# Patient Record
Sex: Female | Born: 1990 | Race: White | Hispanic: No | Marital: Single
Health system: Midwestern US, Community
[De-identification: ages and names within clinical notes are randomized; demographics above are authoritative.]

## PROBLEM LIST (undated history)

## (undated) ENCOUNTER — Inpatient Hospital Stay (HOSPITAL_COMMUNITY): Payer: Self-pay

## (undated) DIAGNOSIS — F319 Bipolar disorder, unspecified: Secondary | ICD-10-CM

## (undated) DIAGNOSIS — IMO0001 Reserved for inherently not codable concepts without codable children: Secondary | ICD-10-CM

## (undated) DIAGNOSIS — O26873 Cervical shortening, third trimester: Secondary | ICD-10-CM

## (undated) DIAGNOSIS — R319 Hematuria, unspecified: Secondary | ICD-10-CM

## (undated) DIAGNOSIS — F419 Anxiety disorder, unspecified: Secondary | ICD-10-CM

## (undated) DIAGNOSIS — F1910 Other psychoactive substance abuse, uncomplicated: Secondary | ICD-10-CM

## (undated) DIAGNOSIS — F909 Attention-deficit hyperactivity disorder, unspecified type: Secondary | ICD-10-CM

## (undated) DIAGNOSIS — O039 Complete or unspecified spontaneous abortion without complication: Secondary | ICD-10-CM

## (undated) DIAGNOSIS — N898 Other specified noninflammatory disorders of vagina: Secondary | ICD-10-CM

## (undated) DIAGNOSIS — R309 Painful micturition, unspecified: Secondary | ICD-10-CM

## (undated) DIAGNOSIS — F32A Depression, unspecified: Secondary | ICD-10-CM

## (undated) DIAGNOSIS — F329 Major depressive disorder, single episode, unspecified: Secondary | ICD-10-CM

## (undated) HISTORY — PX: WISDOM TOOTH EXTRACTION: SHX21

## (undated) HISTORY — PX: INDUCED ABORTION: SHX677

## (undated) HISTORY — DX: Painful micturition, unspecified: R30.9

## (undated) HISTORY — DX: Other specified noninflammatory disorders of vagina: N89.8

## (undated) HISTORY — DX: Attention-deficit hyperactivity disorder, unspecified type: F90.9

## (undated) HISTORY — DX: Anxiety disorder, unspecified: F41.9

## (undated) HISTORY — DX: Hematuria, unspecified: R31.9

## (undated) HISTORY — DX: Other psychoactive substance abuse, uncomplicated: F19.10

## (undated) HISTORY — DX: Complete or unspecified spontaneous abortion without complication: O03.9

## (undated) HISTORY — DX: Reserved for inherently not codable concepts without codable children: IMO0001

## (undated) HISTORY — DX: Bipolar disorder, unspecified: F31.9

---
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
## 2011-01-02 LAB — AMB POC URINALYSIS DIP STICK AUTO W/O MICRO
Bilirubin (UA POC): NEGATIVE
Glucose (UA POC): NEGATIVE
Ketones (UA POC): NEGATIVE
Nitrites (UA POC): NEGATIVE
Protein (UA POC): NEGATIVE mg/dL
Specific gravity (UA POC): 1.025 (ref 1.001–1.035)
Urobilinogen (UA POC): 0.2
pH (UA POC): 5.5 (ref 4.6–8.0)

## 2011-01-02 MED ORDER — CIPROFLOXACIN 250 MG TAB
250 mg | ORAL_TABLET | Freq: Two times a day (BID) | ORAL | Status: AC
Start: 2011-01-02 — End: 2011-01-07

## 2011-01-02 MED ORDER — FLUCONAZOLE 150 MG TAB
150 mg | ORAL_TABLET | Freq: Every day | ORAL | Status: AC
Start: 2011-01-02 — End: 2011-01-03

## 2011-01-02 MED ORDER — LORATADINE 10 MG TAB
10 mg | ORAL_TABLET | Freq: Every day | ORAL | Status: AC
Start: 2011-01-02 — End: 2011-12-28

## 2011-01-02 NOTE — Progress Notes (Signed)
C/o ear ache for 2 weeks feels fluid no otc medication    C/o UTI,  Burning when she urinates, it  Itches has hx of UTI has been using Monistat    Results for orders placed in visit on 01/02/11   AMB POC URINALYSIS DIP STICK AUTO W/O MICRO       Component Value Range    Color Yellow  (none)     Clarity Cloudy  (none)     Glucose Negative  (none)     Bilirubin Negative  (none)     Ketones Negative  (none)     Spec.Grav. 1.025  1.001 - 1.035     Blood Trace  (none)     pH 5.5  4.6 - 8.0     Protein negative  ZOX:WRUEAVWU(JW/JX)    Urobilinogen 0.2 mg/dL      Nitrites Negative  (none)     Leukocyte esterase 1+  (none)

## 2011-01-02 NOTE — Progress Notes (Signed)
C/o ear ache for 2 weeks feels fluid no otc medication    C/o UTI,  Burning when she urinates, it  Itches has hx of UTI has been using Monistat    Results for orders placed in visit on 01/02/11   AMB POC URINALYSIS DIP STICK AUTO W/O MICRO       Component Value Range    Color Yellow  (none)     Clarity Cloudy  (none)     Glucose Negative  (none)     Bilirubin Negative  (none)     Ketones Negative  (none)     Spec.Grav. 1.025  1.001 - 1.035     Blood Trace  (none)     pH 5.5  4.6 - 8.0     Protein negative  ZOX:WRUEAVWU(JW/JX)    Urobilinogen 0.2 mg/dL      Nitrites Negative  (none)     Leukocyte esterase 1+  (none)      Chief Complaint   Patient presents with   ??? Ear Pain   ??? Urinary Pain     she is a 20 y.o. year old female who presents for evalution.    Reviewed and agree with Nurse Note and duplicated in this note.  Reviewed PmHx, RxHx, FmHx, SocHx, AllgHx and updated and dated in the chart.    Review of Systems - negative except as listed above    Objective:     Filed Vitals:    01/02/11 0927   BP: 130/89   Pulse: 87   Height: 5\' 5"  (1.651 m)   Weight: 142 lb (64.411 kg)     Physical Examination: General appearance - alert, well appearing, and in no distress  Eyes - pupils equal and reactive, extraocular eye movements intact  Ears - right TM red, dull, bulging, left TM red, dull, bulging  Nose - normal and patent, no erythema, discharge or polyps  Mouth - mucous membranes moist, pharynx normal without lesions  Neck - supple, no significant adenopathy  Chest - clear to auscultation, no wheezes, rales or rhonchi, symmetric air entry  Heart - normal rate, regular rhythm, normal S1, S2, no murmurs, rubs, clicks or gallops  Abdomen - soft, nontender, nondistended, no masses or organomegaly    Assessment/ Plan:   [place] was seen today for ear pain and urinary pain.    Diagnoses and associated orders for this visit:    Uti (lower urinary tract infection)  - fluconazole (DIFLUCAN) 150 mg tablet; Take 1 Tab by mouth  daily for 1 day.  - ciprofloxacin (CIPRO) 250 mg tablet; Take 1 Tab by mouth every twelve (12) hours for 5 days.    Burning with urination  - AMB POC URINALYSIS DIP STICK AUTO W/O MICRO-pos  - fluconazole (DIFLUCAN) 150 mg tablet; Take 1 Tab by mouth daily for 1 day.  - ciprofloxacin (CIPRO) 250 mg tablet; Take 1 Tab by mouth every twelve (12) hours for 5 days.    Etd (eustachian tube dysfunction)  - loratadine (CLARITIN) 10 mg tablet; Take 1 Tab by mouth daily for 360 days.  -add rx       Follow-up Disposition:  Return if symptoms worsen or fail to improve.    I have discussed the diagnosis with the patient and the intended plan as seen in the above orders.  The patient has received an after-visit summary and questions were answered concerning future plans.     Medication Side Effects and Warnings were discussed with patient: yes  Patient Labs were reviewed and or requested: yes  Patient Past Records were reviewed and or requested  yes    Danley Danker, M.D.    There are no Patient Instructions on file for this visit.

## 2011-01-05 NOTE — Telephone Encounter (Signed)
Please call in medicine  as order on the 21st. Medication not received by pharmacy. Pt wants the medication sent to [facility] [address] in [place].     Medication was sent to [facility] on file.

## 2011-01-05 NOTE — Telephone Encounter (Signed)
[phone]    Spoke with pharmacist. Cancelled medicine sent to [facility]    [phone]    Medication called in to this pharmacy as requested per pt;     [phone] (home)     Left voicemail notifying patient rx ready for pick up at requested pharmacy.

## 2011-01-10 MED ORDER — CIPROFLOXACIN 250 MG TAB
250 mg | ORAL_TABLET | Freq: Two times a day (BID) | ORAL | Status: DC
Start: 2011-01-10 — End: 2013-04-23

## 2011-01-10 MED ORDER — FLUCONAZOLE 150 MG TAB
150 mg | ORAL_TABLET | Freq: Every day | ORAL | Status: DC
Start: 2011-01-10 — End: 2011-03-16

## 2011-01-10 NOTE — Telephone Encounter (Signed)
Pt seen in office on the 21st, medication were to  Be called to her pharmacy, Claratin, Fluconazole, ciprosoxacin.. Pt states she never has received them. pts number [phone].

## 2011-01-10 NOTE — Telephone Encounter (Signed)
Patient never got the prescription from the pharmacy

## 2011-01-10 NOTE — Telephone Encounter (Signed)
Pt would like prescription sent to [facility] number [phone]

## 2011-03-16 LAB — AMB POC URINALYSIS DIP STICK AUTO W/O MICRO
Bilirubin (UA POC): NEGATIVE
Blood (UA POC): NEGATIVE
Glucose (UA POC): NEGATIVE
Ketones (UA POC): NEGATIVE
Nitrites (UA POC): NEGATIVE
Protein (UA POC): 0 mg/dL
Specific gravity (UA POC): 1.03 (ref 1.001–1.035)
Urobilinogen (UA POC): 0.2
pH (UA POC): 6 (ref 4.6–8.0)

## 2011-03-16 MED ORDER — FLUCONAZOLE 150 MG TAB
150 mg | ORAL_TABLET | Freq: Every day | ORAL | Status: AC
Start: 2011-03-16 — End: 2011-03-17

## 2011-03-16 MED ORDER — ETONOGESTREL 0.12 MG-ETHINYL ESTRADIOL 0.015 MG/24 HR VAGINAL RING
VAGINAL | Status: DC
Start: 2011-03-16 — End: 2011-04-12

## 2011-03-16 NOTE — Progress Notes (Signed)
Pt here c/o of vaginal itching x 2 weeks.  Says that her urine has also been cloudy.  Has used Monistat otc, no improvement.    Patient would like to start on birth control to help control irregular menstrual cycles, states that she has trouble remembering to take pills    Subjective:   20 y.o. female complains of white vaginal discharge for 2 weeks..  Denies abnormal vaginal bleeding or significant pelvic pain or  fever. No UTI symptoms. Denies history of known exposure to STD.    Patient's last menstrual period was 03/01/2011.    Objective:   She appears well, afebrile.  Abdomen: benign, soft, nontender, no masses.  Pelvic Exam: examination not indicated.      Assessment/Plan:   C. albicans vulvovaginitis  GC and chlamydia DNA  probe sent to lab.  Treatment: abstain from coitus during course of treatment  ROV prn if symptoms persist or worsen.     1. Cloudy urine  AMB POC URINALYSIS DIP STICK AUTO W/O MICRO   2. Vaginal discharge  fluconazole (DIFLUCAN) 150 mg tablet, NUSWAB VAGINITIS PLUS (VG+)   3. Dysmenorrhea - reviewed choices in birth control, pt would like to try ring, reviewed risks benefits and appropriate use ethinyl estradiol-etonogestrel (NUVARING) 0.12-0.015 mg/24 hr vaginal ring     Encounter Diagnoses   Name Primary?   ??? Cloudy urine Yes   ??? Vaginal discharge    ??? Dysmenorrhea      Orders Placed This Encounter   ??? NUSWAB VAGINITIS PLUS (VG+)   ??? AMB POC URINALYSIS DIP STICK AUTO W/O MICRO   ??? ethinyl estradiol-etonogestrel (NUVARING) 0.12-0.015 mg/24 hr vaginal ring   ??? fluconazole (DIFLUCAN) 150 mg tablet   .

## 2011-03-16 NOTE — Progress Notes (Signed)
Quick Note:    Reviewed with patient during appointment  ______

## 2011-03-16 NOTE — Patient Instructions (Signed)
Vaginal Yeast Infection: After Your Visit  Your Care Instructions  A vaginal yeast infection is caused by too many yeast cells in the vagina. These infections are common in women of all ages. Although itching, vaginal discharge and irritation, and other symptoms can bother you, yeast infections do not usually cause other health problems.  Some medicines???such as antibiotics, birth control pills, hormones, and steroids???can increase your risk of getting a yeast infection. You may also be more likely to get a yeast infection if you are pregnant, have diabetes, douche, or wear tight clothes.  With treatment, most yeast infections get better in 2 to 3 days.  Follow-up care is a key part of your treatment and safety. Be sure to make and go to all appointments, and call your doctor if you are having problems. It???s also a good idea to know your test results and keep a list of the medicines you take.  How can you care for yourself at home?  ?? Take your medicines exactly as prescribed. Call your doctor if you think you are having a problem with your medicine.   ?? Ask your doctor about over-the-counter medicines for yeast infections. They may cost less than prescription medicines. If you use an over-the-counter treatment, read and follow all instructions on the label.   ?? Do not use tampons while using a vaginal cream or suppository. The tampons can absorb the medicine. Use pads instead.   ?? Wear loose cotton clothing. Do not wear nylon or other fabric that holds body heat and moisture close to the skin.   ?? Do not scratch. Relieve itching with a cold pack or a cool bath.   ?? Do not wash your vaginal area more than once a day. Use plain water or a mild, unscented soap.   ?? Change out of wet swimsuits after swimming.   ?? Do not have sex until you have finished your treatment.   ?? Do not douche.   When should you call for help?  Call your doctor now or seek immediate medical care if:  ?? You have unexpected vaginal bleeding.   ??  You have new or increased pain in your vagina or pelvis.   Watch closely for changes in your health, and be sure to contact your doctor if:  ?? You have a fever.   ?? You are not getting better after 2 days.   ?? Your symptoms return after you finish your medicines.     Where can you learn more?    Go to [email or web address]   Enter [phone] in the search box to learn more about "Vaginal Yeast Infection: After Your Visit."    ?? 2006-2012 [employer]. Care instructions adapted under license by [employer] (which disclaims liability or warranty for this information). This care instruction is for use with your licensed healthcare professional. If you have questions about a medical condition or this instruction, always ask your healthcare professional. [employer] disclaims any warranty or liability for your use of this information.  Content Version: 9.3.73196; Last Revised: November 08, 2009

## 2011-03-16 NOTE — Progress Notes (Signed)
Results for orders placed in visit on 03/16/11   AMB POC URINALYSIS DIP STICK AUTO W/O MICRO       Component Value Range    Color Yellow  (none)     Clarity Clear  (none)     Glucose Negative  (none)     Bilirubin Negative  (none)     Ketones Negative  (none)     Spec.Grav. 1.030  1.001 - 1.035     Blood Negative  (none)     pH 6  4.6 - 8.0     Protein 0  ZOX:WRUEAVWU(JW/JX)    Urobilinogen 0.2 mg/dL      Nitrites Negative  (none)     Leukocyte esterase 2+  (none)

## 2011-03-16 NOTE — Progress Notes (Signed)
Pt here c/o of vaginal itching x 2 weeks.  Says that her urine has also been cloudy.  Has used Monistat otc, no improvement.

## 2011-03-18 LAB — NUSWAB VAGINITIS PLUS (VG+)
Atopobium vaginae: 0 Score
BV NAA INTERPRETATION: NEGATIVE
BVAB 2: 0 Score
C. albicans, NAA: POSITIVE — AB
C. glabrata, NAA: NEGATIVE
C. trachomatis, NAA: NEGATIVE
Megasphaera 1: 0 Score
N. gonorrhoeae, NAA: NEGATIVE
T. vaginalis, NAA: NEGATIVE
Total score: 0

## 2011-03-19 NOTE — Progress Notes (Signed)
Quick Note:    Swab positive for yeast, pt treated. Please inform pt of results.  ______

## 2011-03-20 NOTE — Progress Notes (Signed)
Quick Note:    Left a message for pt to return call to office.  ______

## 2011-04-12 MED ORDER — ETONOGESTREL 0.12 MG-ETHINYL ESTRADIOL 0.015 MG/24 HR VAGINAL RING
VAGINAL | Status: DC
Start: 2011-04-12 — End: 2013-04-23

## 2011-05-09 LAB — URINALYSIS, ROUTINE W REFLEX MICROSCOPIC
Bilirubin Urine: NEGATIVE
Glucose, UA: NEGATIVE
Hgb urine dipstick: NEGATIVE
Ketones, ur: NEGATIVE
Nitrite: NEGATIVE
Protein, ur: NEGATIVE
Specific Gravity, Urine: 1.008
Urobilinogen, UA: 1
pH: 7

## 2011-05-09 LAB — WET PREP, GENITAL
Trich, Wet Prep: NONE SEEN
Yeast Wet Prep HPF POC: NONE SEEN

## 2011-05-09 LAB — GC/CHLAMYDIA PROBE AMP, URINE
Chlamydia, Swab/Urine, PCR: NEGATIVE
GC Probe Amp, Urine: NEGATIVE

## 2011-05-09 LAB — GC/CHLAMYDIA PROBE AMP, GENITAL
Chlamydia, DNA Probe: NEGATIVE
GC Probe Amp, Genital: NEGATIVE

## 2011-05-09 LAB — RPR: RPR Ser Ql: NONREACTIVE

## 2011-05-09 LAB — PREGNANCY, URINE: Preg Test, Ur: NEGATIVE

## 2011-05-19 MED ORDER — FLUCONAZOLE 150 MG TAB
150 mg | ORAL_TABLET | Freq: Every day | ORAL | Status: AC
Start: 2011-05-19 — End: 2011-05-20

## 2011-05-19 NOTE — Telephone Encounter (Signed)
Patient states that the medication she was prescribed for her yeast infection is not working.     [phone]

## 2011-05-19 NOTE — Telephone Encounter (Signed)
Refill completed

## 2011-05-19 NOTE — Telephone Encounter (Signed)
Did the infection never clear up from two months ago? Or does pt need a refill?

## 2011-05-19 NOTE — Telephone Encounter (Signed)
Patient needs refill for diflucan

## 2011-08-15 NOTE — L&D Delivery Note (Cosign Needed)
Delivery Note At 9:53 AM a viable female was delivered via Vaginal, Spontaneous Delivery (Presentation: Left Occiput Anterior).  APGAR: pending ; weight pending.   Placenta status: Intact, Spontaneous.  Cord: 3 vessels with the following complications: None.  Anesthesia: Epidural  Episiotomy: None Lacerations: None Est. Blood Loss (mL): 300  Mom to postpartum.  Baby to nursery-stable.  Clancy Gourd 02/02/2012, 10:05 AM  Supervised by [place], CNM

## 2011-08-23 LAB — OB RESULTS CONSOLE HGB/HCT, BLOOD
HCT: 37 %
HCT: 37 %
Hemoglobin: 12.4 g/dL
Hemoglobin: 12.4 g/dL

## 2011-08-23 LAB — OB RESULTS CONSOLE ANTIBODY SCREEN: Antibody Screen: NEGATIVE

## 2011-08-23 LAB — OB RESULTS CONSOLE ABO/RH: RH Type: POSITIVE

## 2011-08-23 LAB — OB RESULTS CONSOLE PLATELET COUNT
Platelets: 325 10*3/uL
Platelets: 325 10*3/uL

## 2011-08-23 LAB — OB RESULTS CONSOLE RUBELLA ANTIBODY, IGM: Rubella: IMMUNE

## 2011-08-23 LAB — OB RESULTS CONSOLE HIV ANTIBODY (ROUTINE TESTING): HIV: NONREACTIVE

## 2011-08-23 LAB — OB RESULTS CONSOLE HEPATITIS B SURFACE ANTIGEN: Hepatitis B Surface Ag: NEGATIVE

## 2011-09-01 LAB — OB RESULTS CONSOLE RPR: RPR: NONREACTIVE

## 2011-10-27 LAB — OB RESULTS CONSOLE HGB/HCT, BLOOD
HCT: 35 %
Hemoglobin: 11.3 g/dL

## 2011-10-27 LAB — GLUCOSE TOLERANCE, 1 HOUR: Glucose, 1 Hour GTT: 86

## 2012-01-03 ENCOUNTER — Encounter: Payer: Self-pay | Admitting: Family

## 2012-01-03 ENCOUNTER — Other Ambulatory Visit: Payer: Self-pay | Admitting: Family

## 2012-01-03 ENCOUNTER — Ambulatory Visit (INDEPENDENT_AMBULATORY_CARE_PROVIDER_SITE_OTHER): Payer: Self-pay | Admitting: Family

## 2012-01-03 VITALS — BP 106/70 | Temp 97.8°F | Ht 65.0 in | Wt 143.3 lb

## 2012-01-03 DIAGNOSIS — O0930 Supervision of pregnancy with insufficient antenatal care, unspecified trimester: Secondary | ICD-10-CM | POA: Insufficient documentation

## 2012-01-03 DIAGNOSIS — Z3480 Encounter for supervision of other normal pregnancy, unspecified trimester: Secondary | ICD-10-CM

## 2012-01-03 DIAGNOSIS — Z3490 Encounter for supervision of normal pregnancy, unspecified, unspecified trimester: Secondary | ICD-10-CM

## 2012-01-03 DIAGNOSIS — N898 Other specified noninflammatory disorders of vagina: Secondary | ICD-10-CM

## 2012-01-03 LAB — POCT URINALYSIS DIP (DEVICE)
Bilirubin Urine: NEGATIVE
Glucose, UA: 100 mg/dL — AB
Hgb urine dipstick: NEGATIVE
Ketones, ur: NEGATIVE mg/dL
Nitrite: NEGATIVE
Protein, ur: 30 mg/dL — AB
Specific Gravity, Urine: 1.02 (ref 1.005–1.030)
Urobilinogen, UA: 0.2 mg/dL (ref 0.0–1.0)
pH: 7.5 (ref 5.0–8.0)

## 2012-01-03 LAB — OB RESULTS CONSOLE GBS: GBS: NEGATIVE

## 2012-01-03 MED ORDER — FLUCONAZOLE 150 MG PO TABS: 150.0000 mg | ORAL_TABLET | Freq: Once | ORAL | Status: AC

## 2012-01-03 NOTE — Progress Notes (Signed)
Pt is a transfer from [place]; records in Media tab; reports white vaginal discharge with itching; GBS/GC & CT with wet prep completed; thick white discharge seen, +vaginal redness.  Reviewed signs of preterm labor.  RX Diflucan.

## 2012-01-03 NOTE — Progress Notes (Signed)
Pulse 84 Has white discharge with itching.

## 2012-01-04 LAB — GC/CHLAMYDIA PROBE AMP, GENITAL
Chlamydia, DNA Probe: NEGATIVE
GC Probe Amp, Genital: NEGATIVE

## 2012-01-04 LAB — WET PREP, GENITAL
Clue Cells Wet Prep HPF POC: NONE SEEN
Trich, Wet Prep: NONE SEEN
Yeast Wet Prep HPF POC: NONE SEEN

## 2012-01-05 LAB — CULTURE, OB URINE: Colony Count: 1000

## 2012-01-06 ENCOUNTER — Encounter: Payer: Self-pay | Admitting: Family

## 2012-01-06 LAB — CULTURE, BETA STREP (GROUP B ONLY)

## 2012-01-17 ENCOUNTER — Encounter: Payer: Self-pay | Admitting: *Deleted

## 2012-01-17 ENCOUNTER — Ambulatory Visit (INDEPENDENT_AMBULATORY_CARE_PROVIDER_SITE_OTHER): Payer: Self-pay | Admitting: Family Medicine

## 2012-01-17 VITALS — BP 119/80 | Temp 96.6°F | Wt 146.6 lb

## 2012-01-17 DIAGNOSIS — Z3480 Encounter for supervision of other normal pregnancy, unspecified trimester: Secondary | ICD-10-CM

## 2012-01-17 DIAGNOSIS — Z3490 Encounter for supervision of normal pregnancy, unspecified, unspecified trimester: Secondary | ICD-10-CM

## 2012-01-17 LAB — POCT URINALYSIS DIP (DEVICE)
Bilirubin Urine: NEGATIVE
Glucose, UA: NEGATIVE mg/dL
Ketones, ur: NEGATIVE mg/dL
Nitrite: NEGATIVE
Protein, ur: 30 mg/dL — AB
Specific Gravity, Urine: 1.02 (ref 1.005–1.030)
Urobilinogen, UA: 0.2 mg/dL (ref 0.0–1.0)
pH: 8.5 — ABNORMAL HIGH (ref 5.0–8.0)

## 2012-01-17 NOTE — Patient Instructions (Signed)
Pregnancy - Third Trimester The third trimester of pregnancy (the last 3 months) is a period of the most rapid growth for you and your baby. The baby approaches a length of 20 inches and a weight of 6 to 10 pounds. The baby is adding on fat and getting ready for life outside your body. While inside, babies have periods of sleeping and waking, suck their thumbs, and hiccups. You can often feel small contractions of the uterus. This is false labor. It is also called Braxton-Hicks contractions. This is like a practice for labor. The usual problems in this stage of pregnancy include more difficulty breathing, swelling of the hands and feet from water retention, and having to urinate more often because of the uterus and baby pressing on your bladder.  PRENATAL EXAMS  Blood work may continue to be done during prenatal exams. These tests are done to check on your health and the probable health of your baby. Blood work is used to follow your blood levels (hemoglobin). Anemia (low hemoglobin) is common during pregnancy. Iron and vitamins are given to help prevent this. You may also continue to be checked for diabetes. Some of the past blood tests may be done again.   The size of the uterus is measured during each visit. This makes sure your baby is growing properly according to your pregnancy dates.   Your blood pressure is checked every prenatal visit. This is to make sure you are not getting toxemia.   Your urine is checked every prenatal visit for infection, diabetes and protein.   Your weight is checked at each visit. This is done to make sure gains are happening at the suggested rate and that you and your baby are growing normally.   Sometimes, an ultrasound is performed to confirm the position and the proper growth and development of the baby. This is a test done that bounces harmless sound waves off the baby so your caregiver can more accurately determine due dates.   Discuss the type of pain  medication and anesthesia you will have during your labor and delivery.   Discuss the possibility and anesthesia if a Cesarean Section might be necessary.   Inform your caregiver if there is any mental or physical violence at home.  Sometimes, a specialized non-stress test, contraction stress test and biophysical profile are done to make sure the baby is not having a problem. Checking the amniotic fluid surrounding the baby is called an amniocentesis. The amniotic fluid is removed by sticking a needle into the belly (abdomen). This is sometimes done near the end of pregnancy if an early delivery is required. In this case, it is done to help make sure the baby's lungs are mature enough for the baby to live outside of the womb. If the lungs are not mature and it is unsafe to deliver the baby, an injection of cortisone medication is given to the mother 1 to 2 days before the delivery. This helps the baby's lungs mature and makes it safer to deliver the baby. CHANGES OCCURING IN THE THIRD TRIMESTER OF PREGNANCY Your body goes through many changes during pregnancy. They vary from person to person. Talk to your caregiver about changes you notice and are concerned about.  During the last trimester, you have probably had an increase in your appetite. It is normal to have cravings for certain foods. This varies from person to person and pregnancy to pregnancy.   You may begin to get stretch marks on your hips,   abdomen, and breasts. These are normal changes in the body during pregnancy. There are no exercises or medications to take which prevent this change.   Constipation may be treated with a stool softener or adding bulk to your diet. Drinking lots of fluids, fiber in vegetables, fruits, and whole grains are helpful.   Exercising is also helpful. If you have been very active up until your pregnancy, most of these activities can be continued during your pregnancy. If you have been less active, it is helpful  to start an exercise program such as walking. Consult your caregiver before starting exercise programs.   Avoid all smoking, alcohol, un-prescribed drugs, herbs and "street drugs" during your pregnancy. These chemicals affect the formation and growth of the baby. Avoid chemicals throughout the pregnancy to ensure the delivery of a healthy infant.   Backache, varicose veins and hemorrhoids may develop or get worse.   You will tire more easily in the third trimester, which is normal.   The baby's movements may be stronger and more often.   You may become short of breath easily.   Your belly button may stick out.   A yellow discharge may leak from your breasts called colostrum.   You may have a bloody mucus discharge. This usually occurs a few days to a week before labor begins.  HOME CARE INSTRUCTIONS   Keep your caregiver's appointments. Follow your caregiver's instructions regarding medication use, exercise, and diet.   During pregnancy, you are providing food for you and your baby. Continue to eat regular, well-balanced meals. Choose foods such as meat, fish, milk and other low fat dairy products, vegetables, fruits, and whole-grain breads and cereals. Your caregiver will tell you of the ideal weight gain.   A physical sexual relationship may be continued throughout pregnancy if there are no other problems such as early (premature) leaking of amniotic fluid from the membranes, vaginal bleeding, or belly (abdominal) pain.   Exercise regularly if there are no restrictions. Check with your caregiver if you are unsure of the safety of your exercises. Greater weight gain will occur in the last 2 trimesters of pregnancy. Exercising helps:   Control your weight.   Get you in shape for labor and delivery.   You lose weight after you deliver.   Rest a lot with legs elevated, or as needed for leg cramps or low back pain.   Wear a good support or jogging bra for breast tenderness during  pregnancy. This may help if worn during sleep. Pads or tissues may be used in the bra if you are leaking colostrum.   Do not use hot tubs, steam rooms, or saunas.   Wear your seat belt when driving. This protects you and your baby if you are in an accident.   Avoid raw meat, cat litter boxes and soil used by cats. These carry germs that can cause birth defects in the baby.   It is easier to loose urine during pregnancy. Tightening up and strengthening the pelvic muscles will help with this problem. You can practice stopping your urination while you are going to the bathroom. These are the same muscles you need to strengthen. It is also the muscles you would use if you were trying to stop from passing gas. You can practice tightening these muscles up 10 times a set and repeating this about 3 times per day. Once you know what muscles to tighten up, do not perform these exercises during urination. It is more likely   to cause an infection by backing up the urine.   Ask for help if you have financial, counseling or nutritional needs during pregnancy. Your caregiver will be able to offer counseling for these needs as well as refer you for other special needs.   Make a list of emergency phone numbers and have them available.   Plan on getting help from family or friends when you go home from the hospital.   Make a trial run to the hospital.   Take prenatal classes with the father to understand, practice and ask questions about the labor and delivery.   Prepare the baby's room/nursery.   Do not travel out of the city unless it is absolutely necessary and with the advice of your caregiver.   Wear only low or no heal shoes to have better balance and prevent falling.  MEDICATIONS AND DRUG USE IN PREGNANCY  Take prenatal vitamins as directed. The vitamin should contain 1 milligram of folic acid. Keep all vitamins out of reach of children. Only a couple vitamins or tablets containing iron may be fatal  to a baby or young child when ingested.   Avoid use of all medications, including herbs, over-the-counter medications, not prescribed or suggested by your caregiver. Only take over-the-counter or prescription medicines for pain, discomfort, or fever as directed by your caregiver. Do not use aspirin, ibuprofen (Motrin, Advil, Nuprin) or naproxen (Aleve) unless OK'd by your caregiver.   Let your caregiver also know about herbs you may be using.   Alcohol is related to a number of birth defects. This includes fetal alcohol syndrome. All alcohol, in any form, should be avoided completely. Smoking will cause low birth rate and premature babies.   Street/illegal drugs are very harmful to the baby. They are absolutely forbidden. A baby born to an addicted mother will be addicted at birth. The baby will go through the same withdrawal an adult does.  SEEK MEDICAL CARE IF: You have any concerns or worries during your pregnancy. It is better to call with your questions if you feel they cannot wait, rather than worry about them. DECISIONS ABOUT CIRCUMCISION You may or may not know the sex of your baby. If you know your baby is a boy, it may be time to think about circumcision. Circumcision is the removal of the foreskin of the penis. This is the skin that covers the sensitive end of the penis. There is no proven medical need for this. Often this decision is made on what is popular at the time or based upon religious beliefs and social issues. You can discuss these issues with your caregiver or pediatrician. SEEK IMMEDIATE MEDICAL CARE IF:   An unexplained oral temperature above 102 F (38.9 C) develops, or as your caregiver suggests.   You have leaking of fluid from the vagina (birth canal). If leaking membranes are suspected, take your temperature and tell your caregiver of this when you call.   There is vaginal spotting, bleeding or passing clots. Tell your caregiver of the amount and how many pads are  used.   You develop a bad smelling vaginal discharge with a change in the color from clear to white.   You develop vomiting that lasts more than 24 hours.   You develop chills or fever.   You develop shortness of breath.   You develop burning on urination.   You loose more than 2 pounds of weight or gain more than 2 pounds of weight or as suggested by your   caregiver.   You notice sudden swelling of your face, hands, and feet or legs.   You develop belly (abdominal) pain. Round ligament discomfort is a common non-cancerous (benign) cause of abdominal pain in pregnancy. Your caregiver still must evaluate you.   You develop a severe headache that does not go away.   You develop visual problems, blurred or double vision.   If you have not felt your baby move for more than 1 hour. If you think the baby is not moving as much as usual, eat something with sugar in it and lie down on your left side for an hour. The baby should move at least 4 to 5 times per hour. Call right away if your baby moves less than that.   You fall, are in a car accident or any kind of trauma.   There is mental or physical violence at home.  Document Released: 07/25/2001 Document Revised: 07/20/2011 Document Reviewed: 01/27/2009 [employer] Patient Information 2012 [employer] 

## 2012-01-17 NOTE — Progress Notes (Signed)
No complaints or questions.  Irreg ctxs.  Denies decreased fetal activity.  F/u 1 week.  UCx sent

## 2012-01-17 NOTE — Progress Notes (Signed)
Pulse: 100

## 2012-01-18 ENCOUNTER — Inpatient Hospital Stay (HOSPITAL_COMMUNITY)
Admission: AD | Admit: 2012-01-18 | Discharge: 2012-01-18 | Disposition: A | Discharge status: Hospice | Payer: Medicaid Other | Source: Ambulatory Visit | Attending: Obstetrics & Gynecology | Admitting: Obstetrics & Gynecology

## 2012-01-18 ENCOUNTER — Encounter (HOSPITAL_COMMUNITY): Payer: Self-pay | Admitting: *Deleted

## 2012-01-18 DIAGNOSIS — N949 Unspecified condition associated with female genital organs and menstrual cycle: Secondary | ICD-10-CM | POA: Insufficient documentation

## 2012-01-18 DIAGNOSIS — O0930 Supervision of pregnancy with insufficient antenatal care, unspecified trimester: Secondary | ICD-10-CM | POA: Insufficient documentation

## 2012-01-18 DIAGNOSIS — O99891 Other specified diseases and conditions complicating pregnancy: Secondary | ICD-10-CM | POA: Insufficient documentation

## 2012-01-18 LAB — URINALYSIS, ROUTINE W REFLEX MICROSCOPIC
Bilirubin Urine: NEGATIVE
Glucose, UA: NEGATIVE mg/dL
Hgb urine dipstick: NEGATIVE
Ketones, ur: NEGATIVE mg/dL
Nitrite: NEGATIVE
Protein, ur: NEGATIVE mg/dL
Specific Gravity, Urine: 1.01 (ref 1.005–1.030)
Urobilinogen, UA: 0.2 mg/dL (ref 0.0–1.0)
pH: 7 (ref 5.0–8.0)

## 2012-01-18 LAB — CULTURE, OB URINE: Colony Count: 5000

## 2012-01-18 LAB — URINE MICROSCOPIC-ADD ON

## 2012-01-18 MED ORDER — ZOLPIDEM TARTRATE 10 MG PO TABS
10.0000 mg | ORAL_TABLET | Freq: Once | ORAL | Status: AC
  Administered 2012-01-18: 10 mg via ORAL
  Filled 2012-01-18: qty 1

## 2012-01-18 NOTE — MAU Note (Signed)
Lower abdominal pressure and tightening of stomach since this morning. Increased urinary frequency but no dysuria. "Pinching" pain in vagina this morning after walking around the mall. Denies vaginal bleeding or discharge. Positive fetal movement.

## 2012-01-18 NOTE — MAU Note (Signed)
Pt c/o sharp, intermittant, pain shooting into vagina occasioanlly throughout the evening after much activity today.  No bleeding or leaking.

## 2012-01-18 NOTE — Discharge Instructions (Signed)

## 2012-01-24 ENCOUNTER — Ambulatory Visit (INDEPENDENT_AMBULATORY_CARE_PROVIDER_SITE_OTHER): Payer: Medicaid Other | Admitting: Obstetrics and Gynecology

## 2012-01-24 VITALS — BP 122/74 | Temp 97.4°F | Wt 145.0 lb

## 2012-01-24 DIAGNOSIS — O2340 Unspecified infection of urinary tract in pregnancy, unspecified trimester: Secondary | ICD-10-CM

## 2012-01-24 DIAGNOSIS — O2390 Unspecified genitourinary tract infection in pregnancy, unspecified trimester: Secondary | ICD-10-CM

## 2012-01-24 DIAGNOSIS — N390 Urinary tract infection, site not specified: Secondary | ICD-10-CM

## 2012-01-24 DIAGNOSIS — Z3480 Encounter for supervision of other normal pregnancy, unspecified trimester: Secondary | ICD-10-CM | POA: Insufficient documentation

## 2012-01-24 LAB — POCT URINALYSIS DIP (DEVICE)
Glucose, UA: NEGATIVE mg/dL
Hgb urine dipstick: NEGATIVE
Ketones, ur: NEGATIVE mg/dL
Nitrite: NEGATIVE
Protein, ur: 100 mg/dL — AB
Specific Gravity, Urine: 1.02 (ref 1.005–1.030)
Urobilinogen, UA: 0.2 mg/dL (ref 0.0–1.0)
pH: 7 (ref 5.0–8.0)

## 2012-01-24 MED ORDER — CEPHALEXIN 500 MG PO CAPS: 500.0000 mg | ORAL_CAPSULE | Freq: Four times a day (QID) | ORAL | Status: AC

## 2012-01-24 NOTE — Progress Notes (Signed)
lg LE on UA and symptomatic x 1 wk for dysuria, frequency, urgency; no CVAT or systemic sx. : C&S sent, Rx Keflex 500 qid x 10. Call if not improved in 2 d

## 2012-01-24 NOTE — Patient Instructions (Addendum)
Pregnancy - Third Trimester The third trimester of pregnancy (the last 3 months) is a period of the most rapid growth for you and your baby. The baby approaches a length of 20 inches and a weight of 6 to 10 pounds. The baby is adding on fat and getting ready for life outside your body. While inside, babies have periods of sleeping and waking, suck their thumbs, and hiccups. You can often feel small contractions of the uterus. This is false labor. It is also called Braxton-Hicks contractions. This is like a practice for labor. The usual problems in this stage of pregnancy include more difficulty breathing, swelling of the hands and feet from water retention, and having to urinate more often because of the uterus and baby pressing on your bladder.  PRENATAL EXAMS  Blood work may continue to be done during prenatal exams. These tests are done to check on your health and the probable health of your baby. Blood work is used to follow your blood levels (hemoglobin). Anemia (low hemoglobin) is common during pregnancy. Iron and vitamins are given to help prevent this. You may also continue to be checked for diabetes. Some of the past blood tests may be done again.   The size of the uterus is measured during each visit. This makes sure your baby is growing properly according to your pregnancy dates.   Your blood pressure is checked every prenatal visit. This is to make sure you are not getting toxemia.   Your urine is checked every prenatal visit for infection, diabetes and protein.   Your weight is checked at each visit. This is done to make sure gains are happening at the suggested rate and that you and your baby are growing normally.   Sometimes, an ultrasound is performed to confirm the position and the proper growth and development of the baby. This is a test done that bounces harmless sound waves off the baby so your caregiver can more accurately determine due dates.   Discuss the type of pain  medication and anesthesia you will have during your labor and delivery.   Discuss the possibility and anesthesia if a Cesarean Section might be necessary.   Inform your caregiver if there is any mental or physical violence at home.  Sometimes, a specialized non-stress test, contraction stress test and biophysical profile are done to make sure the baby is not having a problem. Checking the amniotic fluid surrounding the baby is called an amniocentesis. The amniotic fluid is removed by sticking a needle into the belly (abdomen). This is sometimes done near the end of pregnancy if an early delivery is required. In this case, it is done to help make sure the baby's lungs are mature enough for the baby to live outside of the womb. If the lungs are not mature and it is unsafe to deliver the baby, an injection of cortisone medication is given to the mother 1 to 2 days before the delivery. This helps the baby's lungs mature and makes it safer to deliver the baby. CHANGES OCCURING IN THE THIRD TRIMESTER OF PREGNANCY Your body goes through many changes during pregnancy. They vary from person to person. Talk to your caregiver about changes you notice and are concerned about.  During the last trimester, you have probably had an increase in your appetite. It is normal to have cravings for certain foods. This varies from person to person and pregnancy to pregnancy.   You may begin to get stretch marks on your hips,   abdomen, and breasts. These are normal changes in the body during pregnancy. There are no exercises or medications to take which prevent this change.   Constipation may be treated with a stool softener or adding bulk to your diet. Drinking lots of fluids, fiber in vegetables, fruits, and whole grains are helpful.   Exercising is also helpful. If you have been very active up until your pregnancy, most of these activities can be continued during your pregnancy. If you have been less active, it is helpful  to start an exercise program such as walking. Consult your caregiver before starting exercise programs.   Avoid all smoking, alcohol, un-prescribed drugs, herbs and "street drugs" during your pregnancy. These chemicals affect the formation and growth of the baby. Avoid chemicals throughout the pregnancy to ensure the delivery of a healthy infant.   Backache, varicose veins and hemorrhoids may develop or get worse.   You will tire more easily in the third trimester, which is normal.   The baby's movements may be stronger and more often.   You may become short of breath easily.   Your belly button may stick out.   A yellow discharge may leak from your breasts called colostrum.   You may have a bloody mucus discharge. This usually occurs a few days to a week before labor begins.  HOME CARE INSTRUCTIONS   Keep your caregiver's appointments. Follow your caregiver's instructions regarding medication use, exercise, and diet.   During pregnancy, you are providing food for you and your baby. Continue to eat regular, well-balanced meals. Choose foods such as meat, fish, milk and other low fat dairy products, vegetables, fruits, and whole-grain breads and cereals. Your caregiver will tell you of the ideal weight gain.   A physical sexual relationship may be continued throughout pregnancy if there are no other problems such as early (premature) leaking of amniotic fluid from the membranes, vaginal bleeding, or belly (abdominal) pain.   Exercise regularly if there are no restrictions. Check with your caregiver if you are unsure of the safety of your exercises. Greater weight gain will occur in the last 2 trimesters of pregnancy. Exercising helps:   Control your weight.   Get you in shape for labor and delivery.   You lose weight after you deliver.   Rest a lot with legs elevated, or as needed for leg cramps or low back pain.   Wear a good support or jogging bra for breast tenderness during  pregnancy. This may help if worn during sleep. Pads or tissues may be used in the bra if you are leaking colostrum.   Do not use hot tubs, steam rooms, or saunas.   Wear your seat belt when driving. This protects you and your baby if you are in an accident.   Avoid raw meat, cat litter boxes and soil used by cats. These carry germs that can cause birth defects in the baby.   It is easier to loose urine during pregnancy. Tightening up and strengthening the pelvic muscles will help with this problem. You can practice stopping your urination while you are going to the bathroom. These are the same muscles you need to strengthen. It is also the muscles you would use if you were trying to stop from passing gas. You can practice tightening these muscles up 10 times a set and repeating this about 3 times per day. Once you know what muscles to tighten up, do not perform these exercises during urination. It is more likely   to cause an infection by backing up the urine.   Ask for help if you have financial, counseling or nutritional needs during pregnancy. Your caregiver will be able to offer counseling for these needs as well as refer you for other special needs.   Make a list of emergency phone numbers and have them available.   Plan on getting help from family or friends when you go home from the hospital.   Make a trial run to the hospital.   Take prenatal classes with the father to understand, practice and ask questions about the labor and delivery.   Prepare the baby's room/nursery.   Do not travel out of the city unless it is absolutely necessary and with the advice of your caregiver.   Wear only low or no heal shoes to have better balance and prevent falling.  MEDICATIONS AND DRUG USE IN PREGNANCY  Take prenatal vitamins as directed. The vitamin should contain 1 milligram of folic acid. Keep all vitamins out of reach of children. Only a couple vitamins or tablets containing iron may be fatal  to a baby or young child when ingested.   Avoid use of all medications, including herbs, over-the-counter medications, not prescribed or suggested by your caregiver. Only take over-the-counter or prescription medicines for pain, discomfort, or fever as directed by your caregiver. Do not use aspirin, ibuprofen (Motrin, Advil, Nuprin) or naproxen (Aleve) unless OK'd by your caregiver.   Let your caregiver also know about herbs you may be using.   Alcohol is related to a number of birth defects. This includes fetal alcohol syndrome. All alcohol, in any form, should be avoided completely. Smoking will cause low birth rate and premature babies.   Street/illegal drugs are very harmful to the baby. They are absolutely forbidden. A baby born to an addicted mother will be addicted at birth. The baby will go through the same withdrawal an adult does.  SEEK MEDICAL CARE IF: You have any concerns or worries during your pregnancy. It is better to call with your questions if you feel they cannot wait, rather than worry about them. DECISIONS ABOUT CIRCUMCISION You may or may not know the sex of your baby. If you know your baby is a boy, it may be time to think about circumcision. Circumcision is the removal of the foreskin of the penis. This is the skin that covers the sensitive end of the penis. There is no proven medical need for this. Often this decision is made on what is popular at the time or based upon religious beliefs and social issues. You can discuss these issues with your caregiver or pediatrician. SEEK IMMEDIATE MEDICAL CARE IF:   An unexplained oral temperature above 102 F (38.9 C) develops, or as your caregiver suggests.   You have leaking of fluid from the vagina (birth canal). If leaking membranes are suspected, take your temperature and tell your caregiver of this when you call.   There is vaginal spotting, bleeding or passing clots. Tell your caregiver of the amount and how many pads are  used.   You develop a bad smelling vaginal discharge with a change in the color from clear to white.   You develop vomiting that lasts more than 24 hours.   You develop chills or fever.   You develop shortness of breath.   You develop burning on urination.   You loose more than 2 pounds of weight or gain more than 2 pounds of weight or as suggested by your   caregiver.   You notice sudden swelling of your face, hands, and feet or legs.   You develop belly (abdominal) pain. Round ligament discomfort is a common non-cancerous (benign) cause of abdominal pain in pregnancy. Your caregiver still must evaluate you.   You develop a severe headache that does not go away.   You develop visual problems, blurred or double vision.   If you have not felt your baby move for more than 1 hour. If you think the baby is not moving as much as usual, eat something with sugar in it and lie down on your left side for an hour. The baby should move at least 4 to 5 times per hour. Call right away if your baby moves less than that.   You fall, are in a car accident or any kind of trauma.   There is mental or physical violence at home.  Document Released: 07/25/2001 Document Revised: 07/20/2011 Document Reviewed: 01/27/2009 [facility] Patient Information 2012 [place], [place].Urinary Tract Infection Infections of the urinary tract can start in several places. A bladder infection (cystitis), a kidney infection (pyelonephritis), and a prostate infection (prostatitis) are different types of urinary tract infections (UTIs). They usually get better if treated with medicines (antibiotics) that kill germs. Take all the medicine until it is gone. You or your child may feel better in a few days, but TAKE ALL MEDICINE or the infection may not respond and may become more difficult to treat. HOME CARE INSTRUCTIONS   Drink enough water and fluids to keep the urine clear or pale yellow. Cranberry juice is especially recommended,  in addition to large amounts of water.   Avoid caffeine, tea, and carbonated beverages. They tend to irritate the bladder.   Alcohol may irritate the prostate.   Only take over-the-counter or prescription medicines for pain, discomfort, or fever as directed by your caregiver.  To prevent further infections:  Empty the bladder often. Avoid holding urine for long periods of time.   After a bowel movement, women should cleanse from front to back. Use each tissue only once.   Empty the bladder before and after sexual intercourse.  FINDING OUT THE RESULTS OF YOUR TEST Not all test results are available during your visit. If your or your child's test results are not back during the visit, make an appointment with your caregiver to find out the results. Do not assume everything is normal if you have not heard from your caregiver or the medical facility. It is important for you to follow up on all test results. SEEK MEDICAL CARE IF:   There is back pain.   Your baby is older than 3 months with a rectal temperature of 100.5 F (38.1 C) or higher for more than 1 day.   Your or your child's problems (symptoms) are no better in 3 days. Return sooner if you or your child is getting worse.  SEEK IMMEDIATE MEDICAL CARE IF:   There is severe back pain or lower abdominal pain.   You or your child develops chills.   You have a fever.   Your baby is older than 3 months with a rectal temperature of 102 F (38.9 C) or higher.   Your baby is 92 months old or younger with a rectal temperature of 100.4 F (38 C) or higher.   There is nausea or vomiting.   There is continued burning or discomfort with urination.  MAKE SURE YOU:   Understand these instructions.   Will watch your condition.  Will get help right away if you are not doing well or get worse.  Document Released: 05/10/2005 Document Revised: 07/20/2011 Document Reviewed: 12/13/2006 [facility] Patient Information 2012 [place],  [place].

## 2012-01-24 NOTE — Progress Notes (Signed)
Pulse 83. No pain; pressure in pelvic.

## 2012-01-26 ENCOUNTER — Telehealth: Payer: Self-pay | Admitting: *Deleted

## 2012-01-26 NOTE — Telephone Encounter (Signed)
Pt called stating that she is returning our call.

## 2012-01-26 NOTE — Telephone Encounter (Signed)
Pt left message that she was told @ last 2 appts that she has a bladder infection. She has not received any treatment. She would like a Rx. Her EDD is 01/26/12.  Per chart review, pt had Rx ordered on 6/12.  I called pt @ the number she provided and was not able to leave a message. I then called her home number and left a message that she may pick up her Rx @ [facility]. She may call back on 6/17 if she has further questions.

## 2012-01-29 NOTE — Telephone Encounter (Signed)
Called patient phone number and unable to leave message- heard message person is not taking phone calls at this time- see other phone entry- patient has UTI and rx already sent to [facility]

## 2012-01-31 ENCOUNTER — Encounter: Payer: Self-pay | Admitting: Family Medicine

## 2012-01-31 ENCOUNTER — Ambulatory Visit (INDEPENDENT_AMBULATORY_CARE_PROVIDER_SITE_OTHER): Payer: Medicaid Other | Admitting: Family Medicine

## 2012-01-31 VITALS — BP 127/81 | Temp 97.2°F | Wt 145.0 lb

## 2012-01-31 DIAGNOSIS — O480 Post-term pregnancy: Secondary | ICD-10-CM

## 2012-01-31 LAB — POCT URINALYSIS DIP (DEVICE)
Glucose, UA: NEGATIVE mg/dL
Hgb urine dipstick: NEGATIVE
Ketones, ur: NEGATIVE mg/dL
Nitrite: NEGATIVE
Protein, ur: 30 mg/dL — AB
Specific Gravity, Urine: 1.02 (ref 1.005–1.030)
Urobilinogen, UA: 0.2 mg/dL (ref 0.0–1.0)
pH: 7 (ref 5.0–8.0)

## 2012-01-31 NOTE — Progress Notes (Signed)
Pulse- 79 Pressure- vaginal

## 2012-01-31 NOTE — Telephone Encounter (Signed)
Called pt @ home number and [phone]- unable to leave message @ either tel # .   *Note: message had been previously left for pt on 6/14 that she had a Rx to pick up for UTI.

## 2012-01-31 NOTE — Patient Instructions (Signed)
Your induction of labor is scheduled for February 02, 2012 at 7:00 am. You may eat a light breakfast. Please arrive at the registration desk at [facility] at 6:45 am.   Labor Induction  Most women go into labor on their own between 48 and 42 weeks of the pregnancy. When this does not happen or when there is a medical need, medicine or other methods may be used to induce labor. Labor induction causes a pregnant woman's uterus to contract. It also causes the cervix to soften (ripen), open (dilate), and thin out (efface). Usually, labor is not induced before 39 weeks of the pregnancy unless there is a problem with the baby or mother. Whether your labor will be induced depends on a number of factors, including the following:  The medical condition of you and the baby.   How many weeks along you are.   The status of baby's lung maturity.   The condition of the cervix.   The position of the baby.  REASONS FOR LABOR INDUCTION  The health of the baby or mother is at risk.   The pregnancy is overdue by 1 week or more.   The water breaks but labor does not start on its own.   The mother has a health condition or serious illness such as high blood pressure, infection, placental abruption, or diabetes.   The amniotic fluid amounts are low around the baby.   The baby is distressed.  REASONS TO NOT INDUCE LABOR Labor induction may not be a good idea if:  It is shown that your baby does not tolerate labor.   An induction is just more convenient.   You want the baby to be born on a certain date, like a holiday.   You have had previous surgeries on your uterus, such as a myomectomy or the removal of fibroids.   Your placenta lies very low in the uterus and blocks the opening of the cervix (placenta previa).   Your baby is not in a head down position.   The umbilical cord drops down into the birth canal in front of the baby. This could cut off the baby's blood and oxygen supply.    You have had a previous cesarean delivery.   There areunusual circumstances, such as the baby being extremely premature.  RISKS AND COMPLICATIONS Problems may occur in the process of induction and plans may need to be modified as a situation unfolds. Some of the risks of induction include:  Change in fetal heart rate, such as too high, too low, or erratic.   Risk of fetal distress.   Risk of infection to mother and baby.   Increased chance of having a cesarean delivery.   The rare, but increased chance that the placenta will separate from the uterus (abruption).   Uterine rupture (very rare).  When induction is needed for medical reasons, the benefits of induction may outweigh the risks. BEFORE THE PROCEDURE Your caregiver will check your cervix and the baby's position. This will help your caregiver decide if you are far enough along for an induction to work. PROCEDURE Several methods of labor induction may be used, such as:   Taking prostaglandin medicine to dilate and ripen the cervix. The medicine will also start contractions. It can be taken by mouth or by inserting a suppository into the vagina.   A thin tube (catheter) with a balloon on the end may be inserted into your vagina to dilate the cervix. Once inserted, the  balloon expands with water, which causes the cervix to open.   Striping the membranes. Your caregiver inserts a finger between the cervix and membranes, which causes the cervix to be stretched and may cause the uterus to contract. This is often done during an office visit. You will be sent home to wait for the contractions to begin. You will then come in for an induction.   Breaking the water. Your caregiver will make a hole in the amniotic sac using a small instrument. Once the amniotic sac breaks, contractions should begin. This may still take hours to see an effect.   Taking medicine to trigger or strengthen contractions. This medicine is given intravenously  through a tube in your arm.  All of the methods of induction, besides stripping the membranes, will be done in the hospital. Induction is done in the hospital so that you and the baby can be carefully monitored. AFTER THE PROCEDURE Some inductions can take up to 2 or 3 days. Depending on the cervix, it usually takes less time. It takes longer when you are induced early in the pregnancy or if this is your first pregnancy. If a mother is still pregnant and the induction has been going on for 2 to 3 days, either the mother will be sent home or a cesarean delivery will be needed. Document Released: 12/20/2006 Document Revised: 07/20/2011 Document Reviewed: 06/05/2011 [facility] Patient Information 2012 [place], [place].  Normal Labor and Delivery Your caregiver must first be sure you are in labor. Signs of labor include:  You may pass what is called "the mucus plug" before labor begins. This is a small amount of blood stained mucus.   Regular uterine contractions.   The time between contractions get closer together.   The discomfort and pain gradually gets more intense.   Pains are mostly located in the back.   Pains get worse when walking.   The cervix (the opening of the uterus becomes thinner (begins to efface) and opens up (dilates).  Once you are in labor and admitted into the hospital or care center, your caregiver will do the following:  A complete physical examination.   Check your vital signs (blood pressure, pulse, temperature and the fetal heart rate).   Do a vaginal examination (using a sterile glove and lubricant) to determine:   The position (presentation) of the baby (head [vertex] or buttock first).   The level (station) of the baby's head in the birth canal.   The effacement and dilatation of the cervix.   You may have your pubic hair shaved and be given an enema depending on your caregiver and the circumstance.   An electronic monitor is usually placed on your  abdomen. The monitor follows the length and intensity of the contractions, as well as the baby's heart rate.   Usually, your caregiver will insert an IV in your arm with a bottle of sugar water. This is done as a precaution so that medications can be given to you quickly during labor or delivery.  NORMAL LABOR AND DELIVERY IS DIVIDED UP INTO 3 STAGES: First Stage This is when regular contractions begin and the cervix begins to efface and dilate. This stage can last from 3 to 15 hours. The end of the first stage is when the cervix is 100% effaced and 10 centimeters dilated. Pain medications may be given by   Injection (morphine, demerol, etc.)   Regional anesthesia (spinal, caudal or epidural, anesthetics given in different locations of the spine).  Paracervical pain medication may be given, which is an injection of and anesthetic on each side of the cervix.  A pregnant woman may request to have "Natural Childbirth" which is not to have any medications or anesthesia during her labor and delivery. Second Stage This is when the baby comes down through the birth canal (vagina) and is born. This can take 1 to 4 hours. As the baby's head comes down through the birth canal, you may feel like you are going to have a bowel movement. You will get the urge to bear down and push until the baby is delivered. As the baby's head is being delivered, the caregiver will decide if an episiotomy (a cut in the perineum and vagina area) is needed to prevent tearing of the tissue in this area. The episiotomy is sewn up after the delivery of the baby and placenta. Sometimes a mask with nitrous oxide is given for the mother to breath during the delivery of the baby to help if there is too much pain. The end of Stage 2 is when the baby is fully delivered. Then when the umbilical cord stops pulsating it is clamped and cut. Third Stage The third stage begins after the baby is completely delivered and ends after the placenta  (afterbirth) is delivered. This usually takes 5 to 30 minutes. After the placenta is delivered, a medication is given either by intravenous or injection to help contract the uterus and prevent bleeding. The third stage is not painful and pain medication is usually not necessary. If an episiotomy was done, it is repaired at this time. After the delivery, the mother is watched and monitored closely for 1 to 2 hours to make sure there is no postpartum bleeding (hemorrhage). If there is a lot of bleeding, medication is given to contract the uterus and stop the bleeding. Document Released: 05/09/2008 Document Revised: 07/20/2011 Document Reviewed: 05/09/2008 [facility] Patient Information 2012 [place], [place].  Fetal Movement Counts Patient Name: __________________________________________________ Patient Due Date: ____________________ Melody Haver counts is highly recommended in high risk pregnancies, but it is a good idea for every pregnant woman to do. Start counting fetal movements at 28 weeks of the pregnancy. Fetal movements increase after eating a full meal or eating or drinking something sweet (the blood sugar is higher). It is also important to drink plenty of fluids (well hydrated) before doing the count. Lie on your left side because it helps with the circulation or you can sit in a comfortable chair with your arms over your belly (abdomen) with no distractions around you. DOING THE COUNT  Try to do the count the same time of day each time you do it.   Mark the day and time, then see how long it takes for you to feel 10 movements (kicks, flutters, swishes, rolls). You should have at least 10 movements within 2 hours. You will most likely feel 10 movements in much less than 2 hours. If you do not, wait an hour and count again. After a couple of days you will see a pattern.   What you are looking for is a change in the pattern or not enough counts in 2 hours. Is it taking longer in time to reach 10 movements?   SEEK MEDICAL CARE IF:  You feel less than 10 counts in 2 hours. Tried twice.   No movement in one hour.   The pattern is changing or taking longer each day to reach 10 counts in 2 hours.   You  feel the baby is not moving as it usually does.  Date: ____________ Movements: ____________ Start time: ____________ Doreatha Martin time: ____________  Date: ____________ Movements: ____________ Start time: ____________ Doreatha Martin time: ____________ Date: ____________ Movements: ____________ Start time: ____________ Doreatha Martin time: ____________ Date: ____________ Movements: ____________ Start time: ____________ Doreatha Martin time: ____________ Date: ____________ Movements: ____________ Start time: ____________ Doreatha Martin time: ____________ Date: ____________ Movements: ____________ Start time: ____________ Doreatha Martin time: ____________ Date: ____________ Movements: ____________ Start time: ____________ Doreatha Martin time: ____________ Date: ____________ Movements: ____________ Start time: ____________ Doreatha Martin time: ____________  Date: ____________ Movements: ____________ Start time: ____________ Doreatha Martin time: ____________ Date: ____________ Movements: ____________ Start time: ____________ Doreatha Martin time: ____________ Date: ____________ Movements: ____________ Start time: ____________ Doreatha Martin time: ____________ Date: ____________ Movements: ____________ Start time: ____________ Doreatha Martin time: ____________ Date: ____________ Movements: ____________ Start time: ____________ Doreatha Martin time: ____________ Date: ____________ Movements: ____________ Start time: ____________ Doreatha Martin time: ____________ Date: ____________ Movements: ____________ Start time: ____________ Doreatha Martin time: ____________  Date: ____________ Movements: ____________ Start time: ____________ Doreatha Martin time: ____________ Date: ____________ Movements: ____________ Start time: ____________ Doreatha Martin time: ____________ Date: ____________ Movements: ____________ Start time: ____________ Doreatha Martin  time: ____________ Date: ____________ Movements: ____________ Start time: ____________ Doreatha Martin time: ____________ Date: ____________ Movements: ____________ Start time: ____________ Doreatha Martin time: ____________ Date: ____________ Movements: ____________ Start time: ____________ Doreatha Martin time: ____________ Date: ____________ Movements: ____________ Start time: ____________ Doreatha Martin time: ____________  Date: ____________ Movements: ____________ Start time: ____________ Doreatha Martin time: ____________ Date: ____________ Movements: ____________ Start time: ____________ Doreatha Martin time: ____________ Date: ____________ Movements: ____________ Start time: ____________ Doreatha Martin time: ____________ Date: ____________ Movements: ____________ Start time: ____________ Doreatha Martin time: ____________ Date: ____________ Movements: ____________ Start time: ____________ Doreatha Martin time: ____________ Date: ____________ Movements: ____________ Start time: ____________ Doreatha Martin time: ____________ Date: ____________ Movements: ____________ Start time: ____________ Doreatha Martin time: ____________  Date: ____________ Movements: ____________ Start time: ____________ Doreatha Martin time: ____________ Date: ____________ Movements: ____________ Start time: ____________ Doreatha Martin time: ____________ Date: ____________ Movements: ____________ Start time: ____________ Doreatha Martin time: ____________ Date: ____________ Movements: ____________ Start time: ____________ Doreatha Martin time: ____________ Date: ____________ Movements: ____________ Start time: ____________ Doreatha Martin time: ____________ Date: ____________ Movements: ____________ Start time: ____________ Doreatha Martin time: ____________ Date: ____________ Movements: ____________ Start time: ____________ Doreatha Martin time: ____________  Date: ____________ Movements: ____________ Start time: ____________ Doreatha Martin time: ____________ Date: ____________ Movements: ____________ Start time: ____________ Doreatha Martin time: ____________ Date: ____________  Movements: ____________ Start time: ____________ Doreatha Martin time: ____________ Date: ____________ Movements: ____________ Start time: ____________ Doreatha Martin time: ____________ Date: ____________ Movements: ____________ Start time: ____________ Doreatha Martin time: ____________ Date: ____________ Movements: ____________ Start time: ____________ Doreatha Martin time: ____________ Date: ____________ Movements: ____________ Start time: ____________ Doreatha Martin time: ____________  Date: ____________ Movements: ____________ Start time: ____________ Doreatha Martin time: ____________ Date: ____________ Movements: ____________ Start time: ____________ Doreatha Martin time: ____________ Date: ____________ Movements: ____________ Start time: ____________ Doreatha Martin time: ____________ Date: ____________ Movements: ____________ Start time: ____________ Doreatha Martin time: ____________ Date: ____________ Movements: ____________ Start time: ____________ Doreatha Martin time: ____________ Date: ____________ Movements: ____________ Start time: ____________ Doreatha Martin time: ____________ Date: ____________ Movements: ____________ Start time: ____________ Doreatha Martin time: ____________  Date: ____________ Movements: ____________ Start time: ____________ Doreatha Martin time: ____________ Date: ____________ Movements: ____________ Start time: ____________ Doreatha Martin time: ____________ Date: ____________ Movements: ____________ Start time: ____________ Doreatha Martin time: ____________ Date: ____________ Movements: ____________ Start time: ____________ Doreatha Martin time: ____________ Date: ____________ Movements: ____________ Start time: ____________ Doreatha Martin time: ____________ Date: ____________ Movements: ____________ Start time: ____________ Doreatha Martin time: ____________ Document Released: 08/30/2006 Document Revised: 07/20/2011 Document Reviewed: 03/02/2009 [employer] Patient Information 2012 [place].

## 2012-01-31 NOTE — Progress Notes (Signed)
Requesting IOL. Scheduled 02/02/12 at 0700. Membranes swept. Baby very active on exam. Accels ausculted. Dysuria resolved.

## 2012-02-01 ENCOUNTER — Encounter (HOSPITAL_COMMUNITY): Payer: Self-pay | Admitting: *Deleted

## 2012-02-01 ENCOUNTER — Inpatient Hospital Stay (HOSPITAL_COMMUNITY)
Admission: AD | Admit: 2012-02-01 | Discharge: 2012-02-01 | Disposition: A | Discharge status: Home / Self Care | Payer: Medicaid Other | Source: Ambulatory Visit | Attending: Obstetrics & Gynecology | Admitting: Obstetrics & Gynecology

## 2012-02-01 ENCOUNTER — Telehealth (HOSPITAL_COMMUNITY): Payer: Self-pay | Admitting: *Deleted

## 2012-02-01 DIAGNOSIS — O0930 Supervision of pregnancy with insufficient antenatal care, unspecified trimester: Secondary | ICD-10-CM

## 2012-02-01 DIAGNOSIS — O479 False labor, unspecified: Secondary | ICD-10-CM | POA: Insufficient documentation

## 2012-02-01 MED ORDER — ZOLPIDEM TARTRATE 10 MG PO TABS
10.0000 mg | ORAL_TABLET | Freq: Every evening | ORAL | Status: DC | PRN
  Administered 2012-02-01: 10 mg via ORAL
  Filled 2012-02-01: qty 1

## 2012-02-01 NOTE — Telephone Encounter (Signed)
Preadmission screen  

## 2012-02-01 NOTE — Progress Notes (Signed)
Discharge instructions reviewed with patient. Patient verbalized understanding, and has no questions. Patient understands to return in am for induction of labor. Education on IOL given to patient with discharge instructions.

## 2012-02-02 ENCOUNTER — Encounter (HOSPITAL_COMMUNITY): Payer: Self-pay | Admitting: Anesthesiology

## 2012-02-02 ENCOUNTER — Inpatient Hospital Stay (HOSPITAL_COMMUNITY)
Admission: RE | Admit: 2012-02-02 | Payer: Medicaid Other | Source: Ambulatory Visit | Admitting: Obstetrics & Gynecology

## 2012-02-02 ENCOUNTER — Encounter (HOSPITAL_COMMUNITY): Payer: Self-pay | Admitting: *Deleted

## 2012-02-02 ENCOUNTER — Inpatient Hospital Stay (HOSPITAL_COMMUNITY): Payer: Medicaid Other | Admitting: Anesthesiology

## 2012-02-02 ENCOUNTER — Inpatient Hospital Stay (HOSPITAL_COMMUNITY)
Admission: AD | Admit: 2012-02-02 | Discharge: 2012-02-03 | DRG: 775 | Disposition: A | Discharge status: Home / Self Care | Payer: Medicaid Other | Source: Ambulatory Visit | Attending: Obstetrics & Gynecology | Admitting: Obstetrics & Gynecology

## 2012-02-02 DIAGNOSIS — O480 Post-term pregnancy: Principal | ICD-10-CM | POA: Diagnosis present

## 2012-02-02 LAB — CBC
HCT: 33.1 % — ABNORMAL LOW (ref 36.0–46.0)
Hemoglobin: 10.7 g/dL — ABNORMAL LOW (ref 12.0–15.0)
MCH: 27.3 pg (ref 26.0–34.0)
MCHC: 32.3 g/dL (ref 30.0–36.0)
MCV: 84.4 fL (ref 78.0–100.0)
Platelets: 247 10*3/uL (ref 150–400)
RBC: 3.92 MIL/uL (ref 3.87–5.11)
RDW: 13.4 % (ref 11.5–15.5)
WBC: 20.7 10*3/uL — ABNORMAL HIGH (ref 4.0–10.5)

## 2012-02-02 LAB — TYPE AND SCREEN
ABO/RH(D): B POS
Antibody Screen: NEGATIVE

## 2012-02-02 LAB — ABO/RH: ABO/RH(D): B POS

## 2012-02-02 LAB — RPR: RPR Ser Ql: NONREACTIVE

## 2012-02-02 MED ORDER — LANOLIN HYDROUS EX OINT: TOPICAL_OINTMENT | CUTANEOUS | Status: DC | PRN

## 2012-02-02 MED ORDER — LIDOCAINE HCL (PF) 1 % IJ SOLN
30.0000 mL | INTRAMUSCULAR | Status: DC | PRN
  Filled 2012-02-02: qty 30

## 2012-02-02 MED ORDER — FENTANYL 2.5 MCG/ML BUPIVACAINE 1/10 % EPIDURAL INFUSION (WH - ANES)
INTRAMUSCULAR | Status: DC | PRN
  Administered 2012-02-02: 14 mL/h via EPIDURAL

## 2012-02-02 MED ORDER — WITCH HAZEL-GLYCERIN EX PADS: 1.0000 "application " | MEDICATED_PAD | CUTANEOUS | Status: DC | PRN

## 2012-02-02 MED ORDER — ZOLPIDEM TARTRATE 5 MG PO TABS: 5.0000 mg | ORAL_TABLET | Freq: Every evening | ORAL | Status: DC | PRN

## 2012-02-02 MED ORDER — LACTATED RINGERS IV SOLN: 500.0000 mL | INTRAVENOUS | Status: DC | PRN

## 2012-02-02 MED ORDER — FENTANYL 2.5 MCG/ML BUPIVACAINE 1/10 % EPIDURAL INFUSION (WH - ANES)
14.0000 mL/h | INTRAMUSCULAR | Status: DC
  Administered 2012-02-02: 14 mL/h via EPIDURAL
  Filled 2012-02-02 (×2): qty 60

## 2012-02-02 MED ORDER — ONDANSETRON HCL 4 MG/2ML IJ SOLN
4.0000 mg | Freq: Four times a day (QID) | INTRAMUSCULAR | Status: DC | PRN
  Administered 2012-02-02: 4 mg via INTRAVENOUS
  Filled 2012-02-02: qty 2

## 2012-02-02 MED ORDER — ACETAMINOPHEN 325 MG PO TABS
650.0000 mg | ORAL_TABLET | ORAL | Status: DC | PRN
  Administered 2012-02-02: 650 mg via ORAL
  Filled 2012-02-02: qty 2

## 2012-02-02 MED ORDER — OXYCODONE-ACETAMINOPHEN 5-325 MG PO TABS: 1.0000 | ORAL_TABLET | ORAL | Status: DC | PRN

## 2012-02-02 MED ORDER — OXYTOCIN BOLUS FROM INFUSION
250.0000 mL | Freq: Once | INTRAVENOUS | Status: AC
  Administered 2012-02-02: 250 mL via INTRAVENOUS
  Filled 2012-02-02: qty 500

## 2012-02-02 MED ORDER — DIPHENHYDRAMINE HCL 50 MG/ML IJ SOLN: 12.5000 mg | INTRAMUSCULAR | Status: DC | PRN

## 2012-02-02 MED ORDER — ONDANSETRON HCL 4 MG/2ML IJ SOLN: 4.0000 mg | INTRAMUSCULAR | Status: DC | PRN

## 2012-02-02 MED ORDER — ONDANSETRON HCL 4 MG PO TABS: 4.0000 mg | ORAL_TABLET | ORAL | Status: DC | PRN

## 2012-02-02 MED ORDER — SENNOSIDES-DOCUSATE SODIUM 8.6-50 MG PO TABS
2.0000 | ORAL_TABLET | Freq: Every day | ORAL | Status: DC
  Administered 2012-02-02: 2 via ORAL

## 2012-02-02 MED ORDER — IBUPROFEN 600 MG PO TABS: 600.0000 mg | ORAL_TABLET | Freq: Four times a day (QID) | ORAL | Status: DC | PRN

## 2012-02-02 MED ORDER — LACTATED RINGERS IV SOLN
500.0000 mL | Freq: Once | INTRAVENOUS | Status: AC
  Administered 2012-02-02: 500 mL via INTRAVENOUS

## 2012-02-02 MED ORDER — CITRIC ACID-SODIUM CITRATE 334-500 MG/5ML PO SOLN: 30.0000 mL | ORAL | Status: DC | PRN

## 2012-02-02 MED ORDER — FLEET ENEMA 7-19 GM/118ML RE ENEM: 1.0000 | ENEMA | RECTAL | Status: DC | PRN

## 2012-02-02 MED ORDER — DIPHENHYDRAMINE HCL 25 MG PO CAPS: 25.0000 mg | ORAL_CAPSULE | Freq: Four times a day (QID) | ORAL | Status: DC | PRN

## 2012-02-02 MED ORDER — PHENYLEPHRINE 40 MCG/ML (10ML) SYRINGE FOR IV PUSH (FOR BLOOD PRESSURE SUPPORT)
80.0000 ug | PREFILLED_SYRINGE | INTRAVENOUS | Status: DC | PRN
  Filled 2012-02-02: qty 5

## 2012-02-02 MED ORDER — OXYTOCIN 40 UNITS IN LACTATED RINGERS INFUSION - SIMPLE MED
1.0000 m[IU]/min | INTRAVENOUS | Status: DC
  Filled 2012-02-02: qty 1000

## 2012-02-02 MED ORDER — PRENATAL MULTIVITAMIN CH
1.0000 | ORAL_TABLET | Freq: Every day | ORAL | Status: DC
  Administered 2012-02-03: 1 via ORAL
  Filled 2012-02-02: qty 1

## 2012-02-02 MED ORDER — EPHEDRINE 5 MG/ML INJ
10.0000 mg | INTRAVENOUS | Status: DC | PRN
  Filled 2012-02-02: qty 4

## 2012-02-02 MED ORDER — PHENYLEPHRINE 40 MCG/ML (10ML) SYRINGE FOR IV PUSH (FOR BLOOD PRESSURE SUPPORT): 80.0000 ug | PREFILLED_SYRINGE | INTRAVENOUS | Status: DC | PRN

## 2012-02-02 MED ORDER — OXYTOCIN 40 UNITS IN LACTATED RINGERS INFUSION - SIMPLE MED
62.5000 mL/h | Freq: Once | INTRAVENOUS | Status: AC
  Administered 2012-02-02: 2.5 [IU]/h via INTRAVENOUS

## 2012-02-02 MED ORDER — BENZOCAINE-MENTHOL 20-0.5 % EX AERO: 1.0000 "application " | INHALATION_SPRAY | CUTANEOUS | Status: DC | PRN

## 2012-02-02 MED ORDER — TETANUS-DIPHTH-ACELL PERTUSSIS 5-2.5-18.5 LF-MCG/0.5 IM SUSP: 0.5000 mL | Freq: Once | INTRAMUSCULAR | Status: DC

## 2012-02-02 MED ORDER — SIMETHICONE 80 MG PO CHEW: 80.0000 mg | CHEWABLE_TABLET | ORAL | Status: DC | PRN

## 2012-02-02 MED ORDER — OXYCODONE-ACETAMINOPHEN 5-325 MG PO TABS
1.0000 | ORAL_TABLET | ORAL | Status: DC | PRN
  Administered 2012-02-02: 1 via ORAL
  Filled 2012-02-02: qty 1

## 2012-02-02 MED ORDER — EPHEDRINE 5 MG/ML INJ: 10.0000 mg | INTRAVENOUS | Status: DC | PRN

## 2012-02-02 MED ORDER — FENTANYL CITRATE 0.05 MG/ML IJ SOLN: 100.0000 ug | INTRAMUSCULAR | Status: DC | PRN

## 2012-02-02 MED ORDER — TERBUTALINE SULFATE 1 MG/ML IJ SOLN: 0.2500 mg | Freq: Once | INTRAMUSCULAR | Status: DC | PRN

## 2012-02-02 MED ORDER — IBUPROFEN 600 MG PO TABS
600.0000 mg | ORAL_TABLET | Freq: Four times a day (QID) | ORAL | Status: DC
  Administered 2012-02-02 – 2012-02-03 (×4): 600 mg via ORAL
  Filled 2012-02-02 (×4): qty 1

## 2012-02-02 MED ORDER — DIBUCAINE 1 % RE OINT: 1.0000 "application " | TOPICAL_OINTMENT | RECTAL | Status: DC | PRN

## 2012-02-02 MED ORDER — LIDOCAINE HCL (PF) 1 % IJ SOLN
INTRAMUSCULAR | Status: DC | PRN
  Administered 2012-02-02 (×2): 4 mL

## 2012-02-02 MED ORDER — LACTATED RINGERS IV SOLN
INTRAVENOUS | Status: DC
  Administered 2012-02-02: 07:00:00 via INTRAVENOUS

## 2012-02-02 NOTE — Progress Notes (Signed)
Kimberly Barrera is a 21 y.o. G3P1011 at [redacted]w[redacted]d by LMP admitted for active labor  Subjective: Comfortable with epidural in place  Objective: BP 104/40  Pulse 66  Temp 98.7 F (37.1 C) (Oral)  Resp 18  Ht 5\' 5"  (1.651 m)  Wt 146 lb (66.225 kg)  BMI 24.30 kg/m2  SpO2 97%  LMP 04/21/2011  Breastfeeding? Unknown      FHT:  FHR: 135 bpm, variability: moderate,  accelerations:  Present,  decelerations:  Absent UC:   regular, every 4-6 minutes SVE:   Dilation: 5 Effacement (%): 90 Station: -2 Exam by:: [employer]  Labs: Lab Results  Component Value Date   WBC 20.7* 02/02/2012   HGB 10.7* 02/02/2012   HCT 33.1* 02/02/2012   MCV 84.4 02/02/2012   PLT 247 02/02/2012    Assessment / Plan: Protacted labor after epidural  Labor: Will start pitocin for augmentation Preeclampsia:  na/ Fetal Wellbeing:  Category I Pain Control:  Epidural I/D:  n/a Anticipated MOD:  NSVD  Kimberly Barrera E. 02/02/2012, 6:13 AM

## 2012-02-02 NOTE — Progress Notes (Signed)
UR chart review completed.  

## 2012-02-02 NOTE — Anesthesia Procedure Notes (Signed)
Epidural Patient location during procedure: OB Start time: 02/02/2012 4:40 AM  Staffing Anesthesiologist: Styles Fambro A. Performed by: anesthesiologist   Preanesthetic Checklist Completed: patient identified, site marked, surgical consent, pre-op evaluation, timeout performed, IV checked, risks and benefits discussed and monitors and equipment checked  Epidural Patient position: sitting Prep: site prepped and draped and DuraPrep Patient monitoring: continuous pulse ox and blood pressure Approach: midline Injection technique: LOR air  Needle:  Needle type: Tuohy  Needle gauge: 17 G Needle length: 9 cm Needle insertion depth: 6 cm Catheter type: closed end flexible Catheter size: 19 Gauge Catheter at skin depth: 11 cm Test dose: negative and Other  Assessment Events: blood not aspirated, injection not painful, no injection resistance, negative IV test and no paresthesia  Additional Notes Patient identified. Risks and benefits discussed including failed block, incomplete  Pain control, post dural puncture headache, nerve damage, paralysis, blood pressure Changes, nausea, vomiting, reactions to medications-both toxic and allergic and post Partum back pain. All questions were answered. Patient expressed understanding and wished to proceed. Sterile technique was used throughout procedure. Epidural site was Dressed with sterile barrier dressing. No paresthesias, signs of intravascular injection Or signs of intrathecal spread were encountered.  Patient was more comfortable after the epidural was dosed. Please see RN's note for documentation of vital signs and FHR which are stable.

## 2012-02-02 NOTE — H&P (Signed)
Chief Complaint:  Active Labor  Kimberly Barrera is  21 y.o. G3P1011.  Patient's last menstrual period was 04/21/2011..  [redacted]w[redacted]d   She presents complaining of contractions.  Pt scheduled for IOL for post-dates this morning at 0730. Increasing contractions overnight. Denies bleeding and LOF. Reports good FM.  Obstetrical/Gynecological History: OB History    Grav Para Term Preterm Abortions TAB SAB Ect Mult Living   3 1 1  0 1 0 1 0 0 1      Past Medical History: Past Medical History  Diagnosis Date  . No pertinent past medical history     Past Surgical History: Past Surgical History  Procedure Date  . Wisdom tooth extraction     Family History: Family History  Problem Relation Age of Onset  . Hypertension Father     Social History: History  Substance Use Topics  . Smoking status: Former Smoker -- 0.5 packs/day    Quit date: 04/20/2011  . Smokeless tobacco: Never Used  . Alcohol Use: No    Allergies: No Known Allergies  Prescriptions prior to admission  Medication Sig Dispense Refill  . calcium carbonate (TUMS - DOSED IN MG ELEMENTAL CALCIUM) 500 MG chewable tablet Chew 2 tablets by mouth daily as needed. For heartburn      . cephALEXin (KEFLEX) 500 MG capsule Take 1 capsule (500 mg total) by mouth 4 (four) times daily.  40 capsule  0  . Prenatal Vit-Fe Fumarate-FA (MULTIVITAMIN-PRENATAL) 27-0.8 MG TABS Take 1 tablet by mouth daily.        Review of Systems - History obtained from chart review and the patient Breast ROS: negative Respiratory ROS: no cough, shortness of breath, or wheezing Cardiovascular ROS: no chest pain or dyspnea on exertion Gastrointestinal ROS: contractions Genito-Urinary ROS: no dysuria, trouble voiding, or hematuria negative for - vulvar/vaginal symptoms Neurological ROS: negative  Physical Exam   Blood pressure 104/40, pulse 66, temperature 98.7 F (37.1 C), temperature source Oral, resp. rate 18, height 5\' 5"  (1.651 m), weight 146 lb  (66.225 kg), last menstrual period 04/21/2011, SpO2 97.00%, unknown if currently breastfeeding.  General: General appearance - alert, well appearing, and in no distress and oriented to person, place, and time Eyes - left eye normal, right eye normal Mouth - mucous membranes moist, pharynx normal without lesions Chest - clear to auscultation, no wheezes, rales or rhonchi, symmetric air entry Heart - normal rate, regular rhythm, normal S1, S2, no murmurs, rubs, clicks or gallops Abdomen - gravid, non tender, contracting Extremities - pedal edema trace Focused Gynecological Exam: 4/80/vtx/-2 per RN exam in MAU  Labs: Recent Results (from the past 24 hour(s))  CBC   Collection Time   02/02/12  3:40 AM      Component Value Range   WBC 20.7 (*) 4.0 - 10.5 K/uL   RBC 3.92  3.87 - 5.11 MIL/uL   Hemoglobin 10.7 (*) 12.0 - 15.0 g/dL   HCT 16.1 (*) 09.6 - 04.5 %   MCV 84.4  78.0 - 100.0 fL   MCH 27.3  26.0 - 34.0 pg   MCHC 32.3  30.0 - 36.0 g/dL   RDW 40.9  81.1 - 91.4 %   Platelets 247  150 - 400 K/uL  TYPE AND SCREEN   Collection Time   02/02/12  3:40 AM      Component Value Range   ABO/RH(D) B POS     Antibody Screen NEG     Sample Expiration 02/05/2012  B pos, antibody neg, 1 hour:86, Declined genetics, GBS neg, GC/Chl: neg/neg, HIV neg, RPR neg, HepB Neg    Assessment: Active Labor FHR c/w Well-Being  Plan: Admit to BS with routine orders GBS neg Epidural prn  Paarth Cropper E. 02/02/2012,6:06 AM

## 2012-02-02 NOTE — MAU Note (Signed)
Contractions started 1 1/2 hrs ago and are getting stronger.

## 2012-02-02 NOTE — Anesthesia Preprocedure Evaluation (Signed)

## 2012-02-03 MED ORDER — IBUPROFEN 600 MG PO TABS: 600.0000 mg | ORAL_TABLET | Freq: Four times a day (QID) | ORAL | Status: AC

## 2012-02-03 NOTE — Discharge Summary (Signed)
Obstetric Discharge Summary Reason for Admission: onset of labor Prenatal Procedures: none Intrapartum Procedures: none Postpartum Procedures: none Complications-Operative and Postpartum: none Hemoglobin  Date Value Range Status  02/02/2012 10.7* 12.0 - 15.0 g/dL Final  1/61/0960 45.4   Final     HCT  Date Value Range Status  02/02/2012 33.1* 36.0 - 46.0 % Final  10/27/2011 35   Final    Physical Exam:  General: alert, cooperative, appears stated age and no distress Lochia: appropriate Uterine Fundus: firm Incision: n/a DVT Evaluation: No evidence of DVT seen on physical exam. Negative Homan's sign. No cords or calf tenderness. No significant calf/ankle edema.  Discharge Diagnoses: Term Pregnancy-delivered  Discharge Information: Date: 02/03/2012 Activity: pelvic rest Diet: routine Medications: Ibuprofen Condition: stable Instructions: refer to practice specific booklet Discharge to: home   Newborn Data: Live born female  Birth Weight: 8 lb 1.3 oz (3665 g) APGAR: 8, 9  Home with mother.  Andrena Mews, DO [facility] Resident - PGY-1 02/03/2012 7:37 AM  Seen and examined also by me. Agree with note

## 2012-02-03 NOTE — Anesthesia Postprocedure Evaluation (Signed)
  Anesthesia Post-op Note  Patient: Kimberly Barrera  Procedure(s) Performed: * No procedures listed *  Patient Location: Mother/Baby  Anesthesia Type: Epidural  Level of Consciousness: awake, alert  and oriented  Airway and Oxygen Therapy: Patient Spontanous Breathing  Post-op Pain: none  Post-op Assessment: Post-op Vital signs reviewed, Patient's Cardiovascular Status Stable, No headache, No backache, No residual numbness and No residual motor weakness  Post-op Vital Signs: Reviewed and stable  Complications: No apparent anesthesia complications

## 2012-02-03 NOTE — Discharge Instructions (Signed)
Vaginal Delivery Care After  Change your pad on each trip to the bathroom.   Wipe gently with toilet paper during your hospital stay. Always wipe from front to back. A spray bottle with warm tap water could also be used or a towelette if available.   Place your soiled pad and toilet paper in a bathroom wastebasket with a plastic bag liner.   During your hospital stay, save any clots. If you pass a clot while on the toilet, do not flush it. Also, if your vaginal flow seems excessive to you, notify nursing personnel.   The first time you get out of bed after delivery, wait for assistance from a nurse. Do not get up alone at any time if you feel weak or dizzy.   Bend and extend your ankles forcefully so that you feel the calves of your legs get hard. Do this 6 times every hour when you are in bed and awake.   Do not sit with one foot under you, dangle your legs over the edge of the bed, or maintain a position that hinders the circulation in your legs.   Many women experience after pains for 2 to 3 days after delivery. These after pains are mild uterine contractions. Ask the nurse for a pain medication if you need something for this. Sometimes breastfeeding stimulates after pains; if you find this to be true, ask for the medication  -  hour before the next feeding.   For you and your infant's protection, do not go beyond the door(s) of the obstetric unit. Do not carry your baby in your arms in the hallway. When taking your baby to and from your room, put your baby in the bassinet and push the bassinet.   Mothers may have their babies in their room as much as they desire.  Document Released: 07/28/2000 Document Revised: 07/20/2011 Document Reviewed: 06/28/2007 [employer] Patient Information 2012 [employer]. 

## 2012-02-06 NOTE — Discharge Summary (Signed)
Agree with above note.  Kimberly Barrera 02/06/2012 7:35 AM   

## 2012-03-06 ENCOUNTER — Ambulatory Visit (INDEPENDENT_AMBULATORY_CARE_PROVIDER_SITE_OTHER): Payer: Medicaid Other | Admitting: Obstetrics & Gynecology

## 2012-03-06 ENCOUNTER — Encounter: Payer: Self-pay | Admitting: Obstetrics & Gynecology

## 2012-03-06 VITALS — BP 133/79 | HR 83 | Temp 98.1°F | Ht 65.5 in | Wt 124.8 lb

## 2012-03-06 DIAGNOSIS — F411 Generalized anxiety disorder: Secondary | ICD-10-CM | POA: Insufficient documentation

## 2012-03-06 DIAGNOSIS — F419 Anxiety disorder, unspecified: Secondary | ICD-10-CM

## 2012-03-06 DIAGNOSIS — O99345 Other mental disorders complicating the puerperium: Secondary | ICD-10-CM | POA: Insufficient documentation

## 2012-03-06 MED ORDER — SERTRALINE HCL 25 MG PO TABS: 25.0000 mg | ORAL_TABLET | Freq: Every day | ORAL | Status: DC

## 2012-03-06 NOTE — Progress Notes (Unsigned)
Patient ID: Kimberly Barrera, female   DOB: 1991-04-05, 21 y.o.   MRN: [id number] Pt c/o agitation and anxiety.  Crying a lot. Has no personal h/o taking meds for depression but, has a family h/o depression and anxiety.  Pt requested meds.  Feeling overwhelmed by responsibilites of toddler and new infant.  Has no help from partner.  Family support has waned.  Denies suicidal or homicidal ideation.  Not currently sexually active.  Plan to get Nexplanon when available.  O/ Pt in NAD    Teary when speaking.   Abd: soft, NT, ND GU:  Uterus small mobile.  No adnexal masses. Ext:  Not edema  A/ 4 week postpartum check Postpartum blues/depression- pt requesting meds 2ndary  P/ Zoloft 25mg  po q daily F/u 2 weeks for Nexplanon placement  Raphaela Cannaday L. Harraway-Smith, M.D., [facility]

## 2012-03-06 NOTE — Patient Instructions (Addendum)
Postpartum Depression and Baby Blues The postpartum period begins right after the birth of a baby. During this time, there is often a great amount of joy and excitement. It is also a time of considerable changes in the life of the parent(s). Regardless of how many times a mother gives birth, each child brings new challenges and dynamics to the family. It is not unusual to have feelings of excitement accompanied by confusing shifts in moods, emotions, and thoughts. All mothers are at risk of developing postpartum depression or the "baby blues." These mood changes can occur right after giving birth, or they may occur many months after giving birth. The baby blues or postpartum depression can be mild or severe. Additionally, postpartum depression can resolve rather quickly, or it can be a long-term condition. CAUSES Elevated hormones and their rapid decline are thought to be a main cause of postpartum depression and the baby blues. There are a number of hormones that radically change during and after pregnancy. Estrogen and progesterone usually decrease immediately after delivering your baby. The level of thyroid hormone and various cortisol steroids also rapidly drop. Other factors that play a major role in these changes include major life events and genetics.  RISK FACTORS If you have any of the following risks for the baby blues or postpartum depression, know what symptoms to watch out for during the postpartum period. Risk factors that may increase the likelihood of getting the baby blues or postpartum depression include:  Havinga personal or family history of depression.   Having depression while being pregnant.   Having premenstrual or oral contraceptive-associated mood issues.   Having exceptional life stress.   Having marital conflict.   Lacking a social support network.   Having a baby with special needs.   Having health problems such as diabetes.  SYMPTOMS Baby blues symptoms  include:  Brief fluctuations in mood, such as going from extreme happiness to sadness.   Decreased concentration.   Difficulty sleeping.   Crying spells, tearfulness.   Irritability.   Anxiety.  Postpartum depression symptoms typically begin within the first month after giving birth. These symptoms include:  Difficulty sleeping or excessive sleepiness.   Marked weight loss.   Agitation.   Feelings of worthlessness.   Lack of interest in activity or food.  Postpartum psychosis is a very concerning condition and can be dangerous. Fortunately, it is rare. Displaying any of the following symptoms is cause for immediate medical attention. Postpartum psychosis symptoms include:  Hallucinations and delusions.   Bizarre or disorganized behavior.   Confusion or disorientation.  DIAGNOSIS  A diagnosis is made by an evaluation of your symptoms. There are no medical or lab tests that lead to a diagnosis, but there are various questionnaires that a caregiver may use to identify those with the baby blues, postpartum depression, or psychosis. Often times, a screening tool called the Edinburgh Postnatal Depression Scale is used to diagnose depression in the postpartum period.  TREATMENT The baby blues usually goes away on its own in 1 to 2 weeks. Social support is often all that is needed. You should be encouraged to get adequate sleep and rest. Occasionally, you may be given medicines to help you sleep.  Postpartum depression requires treatment as it can last several months or longer if it is not treated. Treatment may include individual or group therapy, medicine, or both to address any social, physiological, and psychological factors that may play a role in the depression. Regular exercise, a healthy diet,   rest, and social support may also be strongly recommended.  Postpartum psychosis is more serious and needs treatment right away. Hospitalization is often needed. HOME CARE  INSTRUCTIONS  Get as much rest as you can. Nap when the baby sleeps.   Exercise regularly. Some women find yoga and walking to be beneficial.   Eat a balanced and nourishing diet.   Do little things that you enjoy. Have a cup of tea, take a bubble bath, read your favorite magazine, or listen to your favorite music.   Avoid alcohol.   Ask for help with household chores, cooking, grocery shopping, or running errands as needed. Do not try to do everything.   Talk to people close to you about how you are feeling. Get support from your partner, family members, friends, or other new moms.   Try to stay positive in how you think. Think about the things you are grateful for.   Do not spend a lot of time alone.   Only take medicine as directed by your caregiver.   Keep all your postpartum appointments.   Let your caregiver know if you have any concerns.  SEEK MEDICAL CARE IF: You are having a reaction or problems with your medicine. SEEK IMMEDIATE MEDICAL CARE IF:  You have suicidal feelings.   You feel you may harm the baby or someone else.  Document Released: 05/04/2004 Document Revised: 07/20/2011 Document Reviewed: 06/06/2011 [facility] Patient Information 2012 [place], [place].Contraceptive Implant Information A contraceptive implant is a plastic rod that is inserted under the skin. It is usually inserted under the skin of your upper arm. It continually releases small amounts of progestin (synthetic progesterone) into the bloodstream. This prevents an egg from being released from the ovary. It also thickens the cervical mucus to prevent sperm from entering the cervix, and it thins the uterine lining to prevent a fertilized egg from attaching to the uterus. They can be effective for up to 3 years. Implants do not provide protection against sexually transmitted diseases (STDs).  The procedure to insert an implant usually takes about 10 minutes. There may be minor bruising, swelling, and  discomfort at the insertion site for a couple days. The implant begins to work within the first day. Other contraceptive protection should be used for 2 weeks. Follow up with your caregiver to get rechecked as directed. Your caregiver will make sure you are a good candidate for the contraceptive implant. Discuss with your caregiver the possible side effects of the implant ADVANTAGES  It prevents pregnancy for up to 3 years.   It is easily reversible.   It is convenient.   The progestins may protect against uterine and ovarian cancer.   It can be used when breastfeeding.   It can be used by women who cannot take estrogen.  DISADVANTAGES  You may have irregular or unplanned vaginal bleeding.   You may develop side effects, including headache, weight gain, acne, breast tenderness, or mood changes.   You may have tissue or nerve damage after insertion (rare).   It may be difficult and uncomfortable to remove.   Certain medications may interfere with the effectiveness of the implants.  REMOVAL OF IMPLANT The implant should be removed in 3 years or as directed by your caregiver. The implants effect wears off in a few hours after removal. Your ability to get pregnant (fertility) is restored within a couple of weeks. New implants can be inserted as soon as the old ones are removed if desired. DO NOT  GET THE IMPLANT IF:   You are pregnant.   You have a history of breast cancer, osteoporosis, blood clots, heart disease, diabetes, high blood pressure, liver disease, tumors, or stroke.    You have undiagnosed vaginal bleeding.   You have overly sensitive to certain parts of the implant.  Document Released: 07/20/2011 Document Reviewed: 07/18/2011 [facility] Patient Information 2012 [place], [place].

## 2012-03-07 ENCOUNTER — Encounter: Payer: Self-pay | Admitting: Obstetrics & Gynecology

## 2012-03-07 ENCOUNTER — Ambulatory Visit (INDEPENDENT_AMBULATORY_CARE_PROVIDER_SITE_OTHER): Payer: Medicaid Other | Admitting: Obstetrics & Gynecology

## 2012-03-07 VITALS — BP 120/74 | HR 75 | Temp 98.0°F | Resp 12 | Ht 65.0 in | Wt 125.1 lb

## 2012-03-07 DIAGNOSIS — Z30017 Encounter for initial prescription of implantable subdermal contraceptive: Secondary | ICD-10-CM

## 2012-03-07 DIAGNOSIS — Z3040 Encounter for surveillance of contraceptives, unspecified: Secondary | ICD-10-CM

## 2012-03-07 MED ORDER — ETONOGESTREL 68 MG ~~LOC~~ IMPL
1.0000 | DRUG_IMPLANT | Freq: Once | SUBCUTANEOUS | Status: DC
Start: 2012-03-07 — End: 2012-03-07

## 2012-03-07 MED ORDER — ETONOGESTREL 68 MG ~~LOC~~ IMPL
68.0000 mg | DRUG_IMPLANT | Freq: Once | SUBCUTANEOUS | Status: AC
  Administered 2012-03-07: 68 mg via SUBCUTANEOUS

## 2012-03-07 NOTE — Addendum Note (Signed)
Addended by: Kathee Delton on: 03/07/2012 03:33 PM   Modules accepted: Orders

## 2012-03-07 NOTE — Progress Notes (Signed)
Patient given informed consent, she signed consent form. S/p delivery 4 weeks previously. Appropriate time out taken.  Patient's left arm was prepped and draped in the usual sterile fashion.. The ruler used to measure and mark insertion area.  Patient was prepped with alcohol swab and then injected with 5 ml of 1 % lidocaine.  She  Not sexually active. R arm was prepped with betadine, Nexplanon removed from packaging,  Device confirmed in needle, then inserted full length of needle and withdrawn per handbook instructions.  There was minimal blood loss.  Patient insertion site covered with guaze and a pressure bandage to reduce any bruising.  The patient tolerated the procedure well and was given post procedure instructions. Return in about one month for Nexplanon check.  Tajuan Dufault L. Harraway-Smith, M.D., [facility]

## 2012-03-07 NOTE — Patient Instructions (Signed)

## 2013-03-26 NOTE — Progress Notes (Signed)
Chief Complaint   Patient presents with   ??? Hospital Follow Up   ??? Request For New Medication     vyvanse     Follow up visit from [facility] 2 weeks ago dx bacteria vaginosis they gave her flagyl and diflucan but per patient only took 1/2 of the medication because she lost the medication so needs refill for flagyl and diflucan    Per patient needs prescription vyvanse was taking it 2 years ago when she was in school and working but stopped taking it, per patient now she's going back to school and needs something for ADHD    Chief Complaint   Patient presents with   ??? Hospital Follow Up   ??? Request For New Medication     vyvanse     she is a 22 y.o. year old female who presents for evalution.    Reviewed PmHx, RxHx, FmHx, SocHx, AllgHx and updated and dated in the chart.    Nurse notes were reviewed and copied and are correct  Review of Systems - negative except as listed above in the HPI    Objective:     Filed Vitals:    03/26/13 1507   BP: 116/73   Pulse: 67   Temp: 98.3 ??F (36.8 ??C)   TempSrc: Oral   Resp: 18   Height: 5\' 5"  (1.651 m)   Weight: 136 lb (61.689 kg)     Physical Examination: General appearance - alert, well appearing, and in no distress  Chest - clear to auscultation, no wheezes, rales or rhonchi, symmetric air entry  Heart - normal rate, regular rhythm, normal S1, S2, no murmurs, rubs, clicks or gallops  Abdomen - soft, nontender, nondistended, no masses or organomegaly    Assessment/ Plan:   Stacey Sexton was seen today for hospital follow up and request for new medication.    Diagnoses and associated orders for this visit:    Bacterial vaginosis  - metroNIDAZOLE (FLAGYL) 500 mg tablet; Take 1 Tab by mouth two (2) times a day for 7 days.  - fluconazole (DIFLUCAN) 150 mg tablet; Take 1 Tab by mouth daily for 1 day.  -retreat    ADHD (attention deficit hyperactivity disorder)  - lisdexamfetamine (VYVANSE) 30 mg capsule; Take 1 Cap by mouth every morning.  -add rx         Follow-up Disposition:  Return  in about 1 month (around 04/26/2013), or if symptoms worsen or fail to improve.    I have discussed the diagnosis with the patient and the intended plan as seen in the above orders.  The patient has received an after-visit summary and questions were answered concerning future plans.     Medication Side Effects and Warnings were discussed with patient: yes  Patient Labs were reviewed and or requested: yes  Patient Past Records were reviewed and or requested: yes    Danley Danker, M.D.    There are no Patient Instructions on file for this visit.

## 2013-03-26 NOTE — Progress Notes (Signed)
Chief Complaint   Patient presents with   ??? Hospital Follow Up   ??? Request For New Medication     vyvanse     Follow up visit from [facility] 2 weeks ago dx bacteria vaginosis they gave her flagyl and diflucan but per patient only took 1/2 of the medication because she lost the medication so needs refill for flagyl and diflucan    Per patient needs prescription vyvanse was taking it 2 years ago when she was in school and working but stopped taking it, per patient now she's going back to school and needs something for ADHD

## 2013-04-23 NOTE — Progress Notes (Signed)
Chief Complaint   Patient presents with   ??? Medication Evaluation     vyvanse     1 month follow up visit for vyvanse, per patient she doesn't see any different and she cant take naps in the daytime

## 2013-04-23 NOTE — Progress Notes (Signed)
Chief Complaint   Patient presents with   ??? Medication Evaluation     vyvanse     1 month follow up visit for vyvanse, per patient she doesn't see any different and she cant take naps in the daytime    Chief Complaint   Patient presents with   ??? Medication Evaluation     vyvanse     she is a 22 y.o. year old female who presents for evalution.    Reviewed PmHx, RxHx, FmHx, SocHx, AllgHx and updated and dated in the chart.    Nurse notes were reviewed and copied and are correct  Review of Systems - negative except as listed above in the HPI    Objective:     Filed Vitals:    04/23/13 1506   BP: 118/84   Pulse: 87   Temp: 98.5 ??F (36.9 ??C)   TempSrc: Oral   Resp: 17   Height: 5\' 5"  (1.651 m)   Weight: 139 lb (63.05 kg)     Physical Examination: General appearance - alert, well appearing, and in no distress  Neck - supple, no significant adenopathy  Chest - clear to auscultation, no wheezes, rales or rhonchi, symmetric air entry  Heart - normal rate, regular rhythm, normal S1, S2, no murmurs, rubs, clicks or gallops    Assessment/ Plan:   [place] was seen today for medication evaluation.    Diagnoses and associated orders for this visit:    ADHD (attention deficit hyperactivity disorder)  - Lisdexamfetamine (VYVANSE) 50 mg capsule; Take 1 capsule by mouth every morning.  -inc rx       Follow-up Disposition:  Return in about 1 month (around 05/23/2013) for add.    I have discussed the diagnosis with the patient and the intended plan as seen in the above orders.  The patient has received an after-visit summary and questions were answered concerning future plans.     Medication Side Effects and Warnings were discussed with patient: yes  Patient Labs were reviewed and or requested: yes  Patient Past Records were reviewed and or requested: yes    Danley Danker, M.D.    There are no Patient Instructions on file for this visit.

## 2013-05-20 NOTE — Telephone Encounter (Signed)
Pt states that she lost the inhaler that the ER had given her for bronchitis, she state it is albuterol, please advise pt. [phone]

## 2013-05-20 NOTE — Telephone Encounter (Signed)
New rx called in

## 2013-05-20 NOTE — Telephone Encounter (Signed)
[phone]   Patient notified via voicemail

## 2013-05-23 NOTE — Progress Notes (Signed)
Chief Complaint   Patient presents with   ??? Panic Attack      pt became very upset and had a panic attack today and everyday for the past 2 weeks some days  more than one a day. pt  also requesting a medication refill

## 2013-05-23 NOTE — Progress Notes (Signed)
Chief Complaint   Patient presents with   ??? Panic Attack      pt became very upset and had a panic attack today and everyday for the past 2 weeks some days  more than one a day. pt  also requesting a medication refill     she is a 22 y.o. year old female who presents for evalution.    For past 3 wks pt has been crying a lot.  A few times a day will feel throat close, starts vomiting.  Has been ignoring it and just trying to deal with it on her own.  States has similar history when she was a child but then ended up resolving on its own.      Quit job this morning since kept having panic attacks while at work but still has them at other times as well.  Having a lot of trouble with insomnia, unable to fall asleep at night time until around 2am.  Has lost 7lbs in past month, not really eating well.      Has ok support system, mom and 2 kids but not a lot of friends or time for herself.  Did have post-partum depression and was given Zoloft? Which made her more crazy.      Reviewed PmHx, RxHx, FmHx, SocHx, AllgHx and updated and dated in the chart.    Review of Systems - negative except as listed above in the HPI    Objective:     Filed Vitals:    05/23/13 1411   BP: 123/77   Pulse: 81   Temp: 99.3 ??F (37.4 ??C)   Height: 5\' 5"  (1.651 m)   Weight: 132 lb (59.875 kg)     Physical Examination: General appearance - alert, well appearing, and in no distress and crying  Mental status - alert, oriented to person, place, and time, depressed mood, anxious  Chest - clear to auscultation, no wheezes, rales or rhonchi, symmetric air entry  Heart - normal rate, regular rhythm, normal S1, S2, no murmurs, rubs, clicks or gallops    Assessment/ Plan:   [place] was seen today for panic attack.    Diagnoses and associated orders for this visit:    Anxiety  - citalopram (CELEXA) 20 mg tablet; Take 1 tablet by mouth every evening.  - METABOLIC PANEL, COMPREHENSIVE  - TSH, 3RD GENERATION  New start on medication.  Encouragement provided for  pt to make some time for herself, positive thinking, rely on support system.  F/u 2 wks.     Panic attacks  - alprazolam (XANAX) 0.25 mg tablet; Take 1 tablet by mouth three (3) times daily as needed for Anxiety.  - METABOLIC PANEL, COMPREHENSIVE  - TSH, 3RD GENERATION  New start on medications.  Discussed addictive properties of medication.  Should only use as needed and never share medication with others.    Insomnia  Celexa may also help with insomnia.  Sleep hygiene discussed.      ADHD (attention deficit hyperactivity disorder)  - Discontinue: Lisdexamfetamine (VYVANSE) 50 mg capsule; Take 1 capsule by mouth every morning.  - Discontinue: Lisdexamfetamine (VYVANSE) 50 mg capsule; Take 1 capsule by mouth every morning.  - Lisdexamfetamine (VYVANSE) 50 mg capsule; Take 1 capsule by mouth every morning.  Recommend taking break from medication while not working to see if helps decrease anxiety.  However, if not making pt feel better then should continue taking.       Follow-up Disposition:  Return in about  2 weeks (around 06/06/2013).    I have discussed the diagnosis with the patient and the intended plan as seen in the above orders.  The patient has received an after-visit summary and questions were answered concerning future plans.     Medication Side Effects and Warnings were discussed with patient      Adrian Prows, NP

## 2013-06-03 NOTE — Telephone Encounter (Signed)
Ok

## 2013-06-03 NOTE — Telephone Encounter (Signed)
Pt is calling asking if Dr. Okey Dupre can call in an antibiotic for UTI said she gets them all the time and she is trying to save a trip can call pt at [phone]

## 2013-06-03 NOTE — Telephone Encounter (Signed)
[phone]  Notified patient via voicemail  Needs to be seen

## 2013-06-03 NOTE — Telephone Encounter (Signed)
Needs to be seen and may need a work up for recurrent UTIs

## 2013-06-03 NOTE — Telephone Encounter (Signed)
Patient returned call and appt was made for tomorrow
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
## 2013-07-03 NOTE — Progress Notes (Signed)
Chief Complaint   Patient presents with   ??? Medication Refill

## 2013-07-03 NOTE — Patient Instructions (Signed)
Learning About Anxiety Disorders  What are anxiety disorders?  Anxiety disorders are a type of medical problem. They cause severe anxiety. When you feel anxious, you feel that something bad is about to happen. This feeling interferes with your life.  These disorders include:  ?? Generalized anxiety disorder. You feel worried and stressed about many everyday events and activities. This goes on for several months and disrupts your life on most days.  ?? Panic disorder. You have repeated panic attacks. A panic attack is a sudden, intense fear or anxiety. It may make you feel short of breath. Your heart may pound.  ?? Social anxiety disorder. You feel very anxious about what you will say or do in front of people. For example, you may be scared to talk or eat in public. This problem affects your daily life.  ?? Phobias. You are very scared of a specific object, situation, or activity. For example, you may fear spiders, high places, or small spaces.  What are the symptoms?  Generalized anxiety disorder  Symptoms may include:  ?? Feeling worried and stressed about many things almost every day.  ?? Feeling tired or irritable. You may have a hard time concentrating.  ?? Having headaches or muscle aches.  ?? Having a hard time swallowing.  ?? Feeling shaky, sweating, or having hot flashes.  Panic disorder  You may have repeated panic attacks when there is no reason for feeling afraid. You may change your daily activities because you worry that you will have another attack.  Symptoms may include:  ?? Intense fear, terror, or anxiety.  ?? Trouble breathing or very fast breathing.  ?? Chest pain or tightness.  ?? A heartbeat that races or is not regular.  Social anxiety disorder  Symptoms may include:  ?? Fear about a social situation, such as eating in front of others or speaking in public. You may worry a lot. Or you may be afraid that something bad will happen.  ?? Anxiety that can cause you to blush, sweat, and feel shaky.  ?? A heartbeat  that is faster than normal.  ?? A hard time focusing.  Phobias  Symptoms may include:  ?? More fear than most people of being around an object, being in a situation, or doing an activity. You might also be stressed about the chance of being around the thing you fear.  ?? Worry about losing control, panicking, fainting, or having physical symptoms like a faster heartbeat when you are around the situation or object.  How are these disorders treated?  Anxiety disorders can be treated with medicines or counseling. A combination of both may be used.  Medicines may include:  ?? Antidepressants. These may help your symptoms by keeping chemicals in your brain in balance.  ?? Benzodiazepines. These may give you short-term relief of your symptoms.  Some people use cognitive-behavioral therapy. A therapist helps you learn to change stressful or bad thoughts into helpful thoughts.  Lead a healthy lifestyle  A healthy lifestyle may help you feel better.  ?? Get at least 30 minutes of exercise on most days of the week. Walking is a good choice.  ?? Eat a healthy diet. Include fruits, vegetables, lean proteins, and whole grains in your diet each day.  ?? Try to go to bed at the same time every night. Try for 8 hours of sleep a night.  ?? Find ways to manage stress. Try relaxation exercises.  ?? Avoid alcohol and illegal drugs.  Follow-up   care is a key part of your treatment and safety. Be sure to make and go to all appointments, and call your doctor if you are having problems. It's also a good idea to know your test results and keep a list of the medicines you take.   Where can you learn more?   Go to http://www.healthwise.net/[facility]  Enter K667 in the search box to learn more about "Learning About Anxiety Disorders."   ?? 2006-2014 Healthwise, Incorporated. Care instructions adapted under license by [place] (which disclaims liability or warranty for this information). This care instruction is for use with your licensed healthcare  professional. If you have questions about a medical condition or this instruction, always ask your healthcare professional. Healthwise, Incorporated disclaims any warranty or liability for your use of this information.  Content Version: 10.2.346038; Current as of: October 11, 2011

## 2013-07-31 NOTE — Progress Notes (Signed)
Pt here for 1 month follow up on Celexa and Vyvanse.  C/o cough and congestion x 1 week.  States that cough has been productive with thick sputum.  Has taken otc Mucinex.  Also c/o vaginal itching and foul odor x 2 weeks.

## 2013-07-31 NOTE — Progress Notes (Signed)
Chief Complaint   Patient presents with   ??? Medication Evaluation   ??? Cold Symptoms   ??? Vaginal Itching     she is a 22 y.o. year old female who presents for evalution.    Started taking Celexa more regularly, has noticed improvement in mood.  Has been decreasing amount of Xanax she is taking gradually but still likes to have.      Also needs refill on Vyvanse, doing well on current dosage.      Congestion and cough for past week, have been worsening.  No fever or chills.  Has been taking Mucinex which helps slightly.  Thick mucus when blowing nose, face hurts.      Has been vaginal itching and funny smell, no real discharge.  Is sexually active but same partner, no concern for STDs.      Reviewed PmHx, RxHx, FmHx, SocHx, AllgHx and updated and dated in the chart.    Review of Systems - negative except as listed above in the HPI    Objective:     Filed Vitals:    07/31/13 1323   BP: 129/77   Pulse: 98   Temp: 97.6 ??F (36.4 ??C)   TempSrc: Oral   Height: 5\' 5"  (1.651 m)   Weight: 137 lb (62.143 kg)     Physical Examination: General appearance - alert, well appearing, and in no distress  Mental status - alert, oriented to person, place, and time, normal mood, behavior, speech, dress, motor activity, and thought processes, and anxious  Ears - bilateral TM's and external ear canals normal  Nose - normal nontender sinuses, mucosal congestion and mucosal erythema  Mouth - mucous membranes moist, pharynx normal without lesions and erythematous  Neck - supple, no significant adenopathy  Chest - clear to auscultation, no wheezes, rales or rhonchi, symmetric air entry  Heart - normal rate, regular rhythm, normal S1, S2, no murmurs, rubs, clicks or gallops  Pelvic - pt deferred    Assessment/ Plan:   Stacey Sexton was seen today for medication evaluation, cold symptoms and vaginal itching.    Diagnoses and associated orders for this visit:    ADHD (attention deficit hyperactivity disorder)  - Discontinue: Lisdexamfetamine (VYVANSE)  50 mg capsule; Take 1 capsule by mouth every morning.  - Discontinue: Lisdexamfetamine (VYVANSE) 50 mg capsule; Take 1 capsule by mouth every morning. Ok to fill 08/30/13  - Lisdexamfetamine (VYVANSE) 50 mg capsule; Take 1 capsule by mouth every morning. Ok to fill 09/29/13  Stable, given additional 3 mo.  F/U 3 mo.     Anxiety  Cont Celexa.     Panic attacks  - ALPRAZolam (XANAX) 0.25 mg tablet; Take 1 tablet by mouth two (2) times daily as needed for Anxiety. Must last 30 days.  Start to decrease since feeling more stable.  60 tabs and 1 refill to last for 3 months. Discussed addictive properties of medication.  Should only use as needed and never share medication with others.    Acute URI  - azithromycin (ZITHROMAX) 250 mg tablet; Take 2 tabs po today then 1 tab po x 4 days  Discussed and encouraged rest and clear fluids, if congestion is thick should avoid dairy.  Recommended hot showers with steam and elevating head of bed.  May use Vitamin C or Echinacea in addition to current therapy.      Vaginitis  - NUSWAB VAGINITIS  - fluconazole (DIFLUCAN) 150 mg tablet; Take 1 tablet by mouth daily for 1 day.  May repeat in 3 days if needed  Will decide on F/U after reviewing labs.      Follow-up Disposition:  Return in about 3 months (around 10/29/2013).    I have discussed the diagnosis with the patient and the intended plan as seen in the above orders.  The patient has received an after-visit summary and questions were answered concerning future plans.     Medication Side Effects and Warnings were discussed with patient    Adrian Prows, NP

## 2013-07-31 NOTE — Patient Instructions (Addendum)
Anxiety Disorder: After Your Visit  Your Care Instructions  Anxiety is a normal reaction to stress. Difficult situations can cause you to have symptoms such as sweaty palms and a nervous feeling.  In an anxiety disorder, the symptoms are far more severe. Constant worry, muscle tension, trouble sleeping, nausea and diarrhea, and other symptoms can make normal daily activities difficult or impossible. These symptoms may occur for no reason, and they can affect your work, school, or social life. Medicines, counseling, and self-care can all help.  Follow-up care is a key part of your treatment and safety. Be sure to make and go to all appointments, and call your doctor if you are having problems. It's also a good idea to know your test results and keep a list of the medicines you take.  How can you care for yourself at home?  ?? Take medicines exactly as directed. Call your doctor if you think you are having a problem with your medicine.  ?? Go to your counseling sessions and follow-up appointments.  ?? Recognize and accept your anxiety. Then, when you are in a situation that makes you anxious, say to yourself, "This is not an emergency. I feel uncomfortable, but I am not in danger. I can keep going even if I feel anxious."  ?? Be kind to your body:  ?? Relieve tension with exercise or a massage.  ?? Get enough rest.  ?? Avoid alcohol, caffeine, nicotine, and illegal drugs. They can increase your anxiety level and cause sleep problems.  ?? Learn and do relaxation techniques. See below for more about these techniques.  ?? Engage your mind. Get out and do something you enjoy. Go to a funny movie, or take a walk or hike. Plan your day. Having too much or too little to do can make you anxious.  ?? Keep a record of your symptoms. Discuss your fears with a good friend or family member, or join a support group for people with similar problems. Talking to others sometimes relieves stress.  ?? Get involved in social groups, or volunteer  to help others. Being alone sometimes makes things seem worse than they are.  ?? Get at least 30 minutes of exercise on most days of the week to relieve stress. Walking is a good choice. You also may want to do other activities, such as running, swimming, cycling, or playing tennis or team sports.  Relaxation techniques  Do relaxation exercises 10 to 20 minutes a day. You can play soothing, relaxing music while you do them, if you wish.  ?? Tell others in your house that you are going to do your relaxation exercises. Ask them not to disturb you.  ?? Find a comfortable place, away from all distractions and noise.  ?? Lie down on your back, or sit with your back straight.  ?? Focus on your breathing. Make it slow and steady.  ?? Breathe in through your nose. Breathe out through either your nose or mouth.  ?? Breathe deeply, filling up the area between your navel and your rib cage. Breathe so that your belly goes up and down.  ?? Do not hold your breath.  ?? Breathe like this for 5 to 10 minutes. Notice the feeling of calmness throughout your whole body.  As you continue to breathe slowly and deeply, relax by doing the following for another 5 to 10 minutes:  ?? Tighten and relax each muscle group in your body. You can begin at your toes and work   your way up to your head.  ?? Imagine your muscle groups relaxing and becoming heavy.  ?? Empty your mind of all thoughts.  ?? Let yourself relax more and more deeply.  ?? Become aware of the state of calmness that surrounds you.  ?? When your relaxation time is over, you can bring yourself back to alertness by moving your fingers and toes and then your hands and feet and then stretching and moving your entire body. Sometimes people fall asleep during relaxation, but they usually wake up shortly afterward.  ?? Always give yourself time to return to full alertness before you drive a car or do anything that might cause an accident if you are not fully alert. Never play a relaxation tape while  you drive a car.  When should you call for help?  Call 911 anytime you think you may need emergency care. For example, call if:  ?? You feel you cannot stop from hurting yourself or someone else.  Watch closely for changes in your health, and be sure to contact your doctor if:  ?? You have anxiety or fear that affects your life.  ?? You have symptoms of anxiety that are new or different from those you had before.   Where can you learn more?   Go to http://www.healthwise.net/[facility]  Enter P754 in the search box to learn more about "Anxiety Disorder: After Your Visit."   ?? 2006-2014 Healthwise, Incorporated. Care instructions adapted under license by [place] (which disclaims liability or warranty for this information). This care instruction is for use with your licensed healthcare professional. If you have questions about a medical condition or this instruction, always ask your healthcare professional. Healthwise, Incorporated disclaims any warranty or liability for your use of this information.  Content Version: 10.2.346038; Current as of: March 19, 2012

## 2013-08-04 LAB — NUSWAB VAGINITIS
Atopobium vaginae: HIGH Score — AB
C. albicans, NAA: POSITIVE — AB
C. glabrata, NAA: NEGATIVE
T. vaginalis, NAA: NEGATIVE

## 2013-08-04 NOTE — Progress Notes (Signed)
Quick Note:    Verified 3 Pt ID.  Notified Pt and she verbalized understanding.   Please send Rx to pharmacy on file.  ______

## 2013-08-04 NOTE — Progress Notes (Signed)
Quick Note:    Please inform pt vaginal swab came back positive for yeast and borderline BV. Is she feeling better after taking yeast medication? If so then don't need to worry. If still feels like having symptoms will send in rx for BV. No alcohol while taking BV medication.  Thanks,  N  ______

## 2013-08-05 NOTE — Telephone Encounter (Signed)
Rx sent to pharm

## 2013-09-17 LAB — AMB POC URINALYSIS DIP STICK AUTO W/O MICRO
Bilirubin (UA POC): NEGATIVE
Blood (UA POC): NEGATIVE
Glucose (UA POC): NEGATIVE
Ketones (UA POC): NEGATIVE
Leukocyte esterase (UA POC): NEGATIVE
Nitrites (UA POC): NEGATIVE
Protein (UA POC): NEGATIVE mg/dL
Specific gravity (UA POC): 1.025 (ref 1.001–1.035)
Urobilinogen (UA POC): 0.2 (ref 0.2–1)
pH (UA POC): 6 (ref 4.6–8.0)

## 2013-09-17 NOTE — Progress Notes (Signed)
Quick Note:    Results were reviewed with pt in office during appt    ______

## 2013-09-17 NOTE — Progress Notes (Signed)
Chief Complaint   Patient presents with   ??? Pelvic Pain     c/o of sharp pain in pelvic area  and burning when urinating with some incomplete urination for the last 2 weeks.     she is a 23 y.o. year old female who presents for evalution.    Pt has been having pelvic pain deep inside x 2 wks.  At first just thought due to period, but then has not resolved.  Pain is deep in pelvic, radiates into back, certain movements make it worse.  Pt states has also been having dysparunia since birth of child about a year ago, has not been having sex since has these symptoms.  No real discharge, does have some odor.      Bladder pain with urination, sometimes has to press on bladder to help with pain.  Sometimes will try to take warm bath which also helps.      Having irregular periods since had Implanon placed in arm.  Sometimes will bleed for 2 wks, sometimes will go a month without bleeding.      Reviewed PmHx, RxHx, FmHx, SocHx, AllgHx and updated and dated in the chart.    Review of Systems - negative except as listed above in the HPI    Objective:     Filed Vitals:    09/17/13 0823   BP: 129/76   Pulse: 69   Temp: 98.5 ??F (36.9 ??C)   Height: 5\' 5"  (1.651 m)   Weight: 140 lb (63.504 kg)   SpO2: 97%     Physical Examination: General appearance - alert, well appearing, and in no distress  Chest - clear to auscultation, no wheezes, rales or rhonchi, symmetric air entry  Heart - normal rate, regular rhythm, normal S1, S2, no murmurs, rubs, clicks or gallops  Pelvic - VULVA: normal appearing vulva with no masses, tenderness or lesions, VAGINA: normal appearing vagina with normal color and discharge, no lesions, CERVIX: normal appearing cervix without discharge or lesions, cervical motion tenderness present, UTERUS: uterus is normal size, shape, consistency, tenderness generalized, ADNEXA: normal adnexa in size, nontender and no masses    Assessment/ Plan:   [name] was seen today for pelvic pain.    Diagnoses and associated  orders for this visit:    PID (acute pelvic inflammatory disease)  - cefTRIAXone (ROCEPHIN) injection; 250 mg by IntraMUSCular route once for 1 dose.  - CEFTRIAXONE SODIUM INJECTION PER 250 MG (Qty 1 for 250 mg)  - THER/PROPH/DIAG INJECTION, SUBCUT/IM  - azithromycin (ZITHROMAX) 500 mg tablet; Take 2 tablets by mouth every seven (7) days.  - CBC WITH AUTOMATED DIFF  - NUSWAB VAGINITIS PLUS  - CULTURE, GENITAL  Will decide on F/U after reviewing labs.     Urinary retention with incomplete bladder emptying  - AMB POC URINALYSIS DIP STICK AUTO W/O MICRO  No signs of UTI today, suspect secondary to PID.  Will decide on F/U after reviewing labs.      Follow-up Disposition:  Return if symptoms worsen or fail to improve.    I have discussed the diagnosis with the patient and the intended plan as seen in the above orders.  The patient has received an after-visit summary and questions were answered concerning future plans.     Medication Side Effects and Warnings were discussed with patient    Adrian ProwsNancy E Feliza Diven, NP

## 2013-09-17 NOTE — Progress Notes (Signed)
Chief Complaint   Patient presents with   ??? Pelvic Pain     c/o of sharp pain in pelvic area  and burning when urinating with some incomplete urination for the last 2 weeks.     Results for orders placed in visit on 09/17/13   AMB POC URINALYSIS DIP STICK AUTO W/O MICRO       Result Value Range    Color (UA POC) Yellow      Clarity (UA POC) Clear      Glucose (UA POC) Negative  Negative    Bilirubin (UA POC) Negative  Negative    Ketones (UA POC) Negative  Negative    Specific gravity (UA POC) 1.025  1.001 - 1.035    Blood (UA POC) Negative  Negative    pH (UA POC) 6.0  4.6 - 8.0    Protein (UA POC) Negative  Negative mg/dL    Urobilinogen (UA POC) 0.2 mg/dL  0.2 - 1    Nitrites (UA POC) Negative  Negative    Leukocyte esterase (UA POC) Negative  Negative

## 2013-09-20 LAB — NUSWAB VAGINITIS PLUS
Atopobium vaginae: HIGH Score — AB
C. albicans, NAA: NEGATIVE
C. glabrata, NAA: NEGATIVE
C. trachomatis, NAA: NEGATIVE
N. gonorrhoeae, NAA: NEGATIVE
T. vaginalis, NAA: NEGATIVE

## 2013-09-21 LAB — CBC WITH AUTOMATED DIFF
ABS. BASOPHILS: 0 10*3/uL (ref 0.0–0.2)
ABS. EOSINOPHILS: 0.1 10*3/uL (ref 0.0–0.4)
ABS. IMM. GRANS.: 0 10*3/uL (ref 0.0–0.1)
ABS. MONOCYTES: 0.7 10*3/uL (ref 0.1–0.9)
ABS. NEUTROPHILS: 5 10*3/uL (ref 1.4–7.0)
Abs Lymphocytes: 2.3 10*3/uL (ref 0.7–3.1)
BASOPHILS: 0 %
EOSINOPHILS: 1 %
HCT: 42.7 % (ref 34.0–46.6)
HGB: 14.5 g/dL (ref 11.1–15.9)
IMMATURE GRANULOCYTES: 0 %
Lymphocytes: 28 %
MCH: 32.5 pg (ref 26.6–33.0)
MCHC: 34 g/dL (ref 31.5–35.7)
MCV: 96 fL (ref 79–97)
MONOCYTES: 9 %
NEUTROPHILS: 62 %
PLATELET: 328 10*3/uL (ref 150–379)
RBC: 4.46 x10E6/uL (ref 3.77–5.28)
RDW: 12.7 % (ref 12.3–15.4)
WBC: 8.1 10*3/uL (ref 3.4–10.8)

## 2013-09-21 LAB — CULTURE, GENITAL

## 2013-09-22 NOTE — Progress Notes (Signed)
Quick Note:    Please inform pt her labs were normal aside from one vaginal swab which showed group B strep in vagina (not sexually transmitted). Was treated correctly in office, how is she feeling?   Thanks,  N  ______

## 2013-09-22 NOTE — Progress Notes (Signed)
Quick Note:    Pt called and LVM to return my call.  ______

## 2013-09-24 NOTE — Progress Notes (Signed)
Quick Note:    Pt called back and ID x 3 pt is aware of results and said she is still having pelvic pain , made appointment for tomorrow.  ______

## 2013-09-24 NOTE — Progress Notes (Signed)
Quick Note:    Pt called and LVM to return my call.  ______

## 2013-10-20 LAB — AMB POC URINALYSIS DIP STICK AUTO W/O MICRO
Bilirubin (UA POC): NEGATIVE
Glucose (UA POC): NEGATIVE
Ketones (UA POC): NEGATIVE
Nitrites (UA POC): NEGATIVE
Protein (UA POC): NEGATIVE mg/dL
Specific gravity (UA POC): 1.02 (ref 1.001–1.035)
Urobilinogen (UA POC): 0.2 (ref 0.2–1)
pH (UA POC): 7 (ref 4.6–8.0)

## 2013-10-20 MED ORDER — FLUCONAZOLE 150 MG TAB
150 mg | ORAL_TABLET | Freq: Every day | ORAL | Status: AC
Start: 2013-10-20 — End: 2013-10-21

## 2013-10-20 MED ORDER — PHENAZOPYRIDINE 100 MG TAB
100 mg | ORAL_TABLET | Freq: Three times a day (TID) | ORAL | Status: AC | PRN
Start: 2013-10-20 — End: 2013-10-23

## 2013-10-20 MED ORDER — LISDEXAMFETAMINE 50 MG CAPSULE
50 mg | ORAL_CAPSULE | ORAL | Status: DC
Start: 2013-10-20 — End: 2014-03-27

## 2013-10-20 MED ORDER — CIPROFLOXACIN 500 MG TAB
500 mg | ORAL_TABLET | Freq: Two times a day (BID) | ORAL | Status: AC
Start: 2013-10-20 — End: 2013-10-25

## 2013-10-20 MED ORDER — LISDEXAMFETAMINE 50 MG CAPSULE
50 mg | ORAL_CAPSULE | ORAL | Status: DC
Start: 2013-10-20 — End: 2013-10-20

## 2013-10-20 NOTE — Progress Notes (Signed)
Chief Complaint   Patient presents with   ??? Pelvic Pain     flare up      Results for orders placed in visit on 10/20/13   AMB POC URINALYSIS DIP STICK AUTO W/O MICRO       Result Value Ref Range    Color (UA POC) Yellow      Clarity (UA POC) Slightly Cloudy      Glucose (UA POC) Negative  Negative    Bilirubin (UA POC) Negative  Negative    Ketones (UA POC) Negative  Negative    Specific gravity (UA POC) 1.020  1.001 - 1.035    Blood (UA POC) Trace  Negative    pH (UA POC) 7.0  4.6 - 8.0    Protein (UA POC) Negative  Negative mg/dL    Urobilinogen (UA POC) 0.2 mg/dL  0.2 - 1    Nitrites (UA POC) Negative  Negative    Leukocyte esterase (UA POC) 1+  Negative

## 2013-10-20 NOTE — Progress Notes (Signed)
Chief Complaint   Patient presents with   ??? Pelvic Pain     flare up      she is a 23 y.o. year old female who presents for evalution.    Still has pelvic pain, no improvement after treatment last time.  If anything has been worsening.   Having vaginal discharge, itching and irritating, also notices odor.  Having pain after urination.  No urinary frequency.  Does sometimes have incontinence, wonders if pelvic pain is due to bladder problems.     Also needs refill on Vyvanse.  Feels stable on current dose of medication.     Reviewed PmHx, RxHx, FmHx, SocHx, AllgHx and updated and dated in the chart.    Review of Systems - negative except as listed above in the HPI    Objective:     Filed Vitals:    10/20/13 1528   BP: 123/82   Pulse: 64   Temp: 98 ??F (36.7 ??C)   Height: 5\' 5"  (1.651 m)   Weight: 141 lb (63.957 kg)   SpO2: 100%     Physical Examination: General appearance - alert, well appearing, and in no distress  Chest - clear to auscultation, no wheezes, rales or rhonchi, symmetric air entry  Heart - normal rate, regular rhythm, normal S1, S2, no murmurs, rubs, clicks or gallops  Pelvic - exam declined by the patient    Assessment/ Plan:   [name] was seen today for pelvic pain.    Diagnoses and associated orders for this visit:    Pelvic pain in female  - US PELV NON OBS; Future  - REFERRAL TO UROGYNECOLOGY  - NUSWAB VAGINITIS PLUS  - fluconazole (DIFLUCAN) 150 mg tablet; Take 1 Tab by mouth daily for 1 day. May repeat in 3 days if needed  - AMB POC URINALYSIS DIP STICK AUTO W/O MICRO  Unclear etiology, obtain US.  F/U with UroGYN.     Dysuria  - REFERRAL TO UROGYNECOLOGY  - CULTURE, URINE  - ciprofloxacin HCl (CIPRO) 500 mg tablet; Take 1 Tab by mouth two (2) times a day for 5 days.  - phenazopyridine (PYRIDIUM) 100 mg tablet; Take 1 Tab by mouth three (3) times daily as needed for Pain for up to 3 days.  - AMB POC URINALYSIS DIP STICK AUTO W/O MICRO  Dipstick suggestive of infection. Pt instructed to take full  course of antibiotics and increase water consumption.  F/U if no improvement or develops fever/chills/nausea/vomiting.      Vaginal discharge  - NUSWAB VAGINITIS PLUS  - AMB POC URINALYSIS DIP STICK AUTO W/O MICRO  Will decide on F/U after reviewing labs.     ADHD (attention deficit hyperactivity disorder)  - Discontinue: Lisdexamfetamine (VYVANSE) 50 mg capsule; Take 1 Cap by mouth every morning. Ok to fill 10/27/13  - Discontinue: Lisdexamfetamine (VYVANSE) 50 mg capsule; Take 1 Cap by mouth every morning. Ok to fill 11/27/13  - Lisdexamfetamine (VYVANSE) 50 mg capsule; Take 1 Cap by mouth every morning. Ok to fill 12/27/13  Reviewed PMP, pt taking medication appropriately.      Follow-up Disposition:  Return if symptoms worsen or fail to improve.    I have discussed the diagnosis with the patient and the intended plan as seen in the above orders.  The patient has received an after-visit summary and questions were answered concerning future plans.     Medication Side Effects and Warnings were discussed with patient    Adrian ProwsNancy E Kimble Delaurentis, NP

## 2013-10-20 NOTE — Progress Notes (Signed)
Quick Note:    Results were reviewed with pt in office during appt    ______

## 2013-10-23 LAB — NUSWAB VAGINITIS PLUS
C. albicans, NAA: POSITIVE — AB
C. glabrata, NAA: NEGATIVE
C. trachomatis, NAA: NEGATIVE
N. gonorrhoeae, NAA: NEGATIVE
T. vaginalis, NAA: NEGATIVE

## 2013-10-23 LAB — CULTURE, URINE

## 2013-10-23 NOTE — Progress Notes (Signed)
Quick Note:    Spoke with patient using 2 identifiers. Advised that labs came back positive for yeast only and the treatment given in office should clear it up. Patient verbalized understanding and had no additional questions at this time.  ______

## 2013-10-23 NOTE — Progress Notes (Signed)
Quick Note:    Please inform pt vaginal swabs came back showing yeast only. Still recommend she F/U with UroGYN. Have sent in rx to pharmacy.   Thanks,  N  ______

## 2013-10-23 NOTE — Progress Notes (Signed)
Quick Note:    Please inform pt she has a right ovarian cyst shown on US but I do not think that is causing all of her pain. Cysts can be a normal finding as women ovulate so I definitely still want her to F/U with specialist we discussed at her last appt. Let me know if she has any questions.   Thanks,  N  ______

## 2013-10-23 NOTE — Progress Notes (Signed)
Quick Note:    Called patient ID [phone] [id number] verified, informed of US results and make appt with specialists- patient voiced understanding.  ______

## 2013-10-23 NOTE — Progress Notes (Signed)
Quick Note:    CORRECTION - she was treated correctly in office, no rx sent to pharm.  Thanks,  N  ______

## 2013-10-31 MED ORDER — CLONAZEPAM 0.5 MG TAB
0.5 mg | ORAL_TABLET | Freq: Two times a day (BID) | ORAL | Status: DC | PRN
Start: 2013-10-31 — End: 2013-10-31

## 2013-10-31 MED ORDER — CLONAZEPAM 0.5 MG TAB
0.5 mg | ORAL_TABLET | Freq: Two times a day (BID) | ORAL | Status: DC | PRN
Start: 2013-10-31 — End: 2014-03-27

## 2013-10-31 MED ORDER — BUSPIRONE 7.5 MG TAB
7.5 mg | ORAL_TABLET | Freq: Two times a day (BID) | ORAL | Status: DC
Start: 2013-10-31 — End: 2014-03-27

## 2013-10-31 NOTE — Progress Notes (Signed)
Chief Complaint   Patient presents with   ??? Anxiety     she is a 23 y.o. year old female who presents for evalution.    Stopped taking Celexa, states made her feel worse instead of better.  Also had same reaction with Zoloft.  Had been feeling pretty stable but then had interview at [employer] this morning for job.  In middle of interview started feeling like had panic attack, ended up having to leave interview and then had panic attack in bathroom there.  Felt unable to breathe so used albuterol inhaler but didn't help.      Also in process of moving out from [employer] house, signed new lease this AM.      Reviewed PmHx, RxHx, FmHx, SocHx, AllgHx and updated and dated in the chart.    Review of Systems - negative except as listed above in the HPI    Objective:     Filed Vitals:    10/31/13 1345   BP: 135/73   Pulse: 78   Temp: 98.5 ??F (36.9 ??C)   TempSrc: Oral   Resp: 18   Weight: 139 lb 14.4 oz (63.458 kg)   SpO2: 97%     Physical Examination: General appearance - alert, well appearing, and in no distress and anxious  Mental status - alert, oriented to person, place, and time, anxious  Chest - clear to auscultation, no wheezes, rales or rhonchi, symmetric air entry  Heart - normal rate, regular rhythm, normal S1, S2, no murmurs, rubs, clicks or gallops    Assessment/ Plan:   [name] was seen today for anxiety.    Diagnoses and associated orders for this visit:    Anxiety  - busPIRone (BUSPAR) 7.5 mg tablet; Take 1 Tab by mouth two (2) times a day.  - clonazePAM (KLONOPIN) 0.5 mg tablet; Take 1 Tab by mouth two (2) times daily as needed. Max Daily Amount: 1 mg.  Positive thinking, rely on support system.  F/U 1 mo.      Follow-up Disposition:  Return in about 1 month (around 12/01/2013) for anxiety.    I have discussed the diagnosis with the patient and the intended plan as seen in the above orders.  The patient has received an after-visit summary and questions were answered concerning future plans.     Medication Side  Effects and Warnings were discussed with patient      Adrian ProwsNancy E Rudine Rieger, NP

## 2013-10-31 NOTE — Progress Notes (Signed)
To be seen for anxiety.  Wants to get new medication. States she had a bad anxiety attach earlier today. She states when she's around a lot of people in public she begins to feel anxious.

## 2014-03-27 MED ORDER — LISDEXAMFETAMINE 50 MG CAPSULE
50 mg | ORAL_CAPSULE | ORAL | Status: DC
Start: 2014-03-27 — End: 2014-03-27

## 2014-03-27 MED ORDER — LISDEXAMFETAMINE 50 MG CAPSULE
50 mg | ORAL_CAPSULE | ORAL | Status: DC
Start: 2014-03-27 — End: 2014-07-01

## 2014-03-27 MED ORDER — CLONAZEPAM 0.5 MG TAB
0.5 mg | ORAL_TABLET | Freq: Two times a day (BID) | ORAL | Status: DC | PRN
Start: 2014-03-27 — End: 2014-06-11

## 2014-03-27 MED ORDER — BUPROPION XL 150 MG 24 HR TAB
150 mg | ORAL_TABLET | ORAL | Status: DC
Start: 2014-03-27 — End: 2014-04-24

## 2014-03-27 NOTE — Patient Instructions (Signed)
Anxiety Disorder: After Your Visit  Your Care Instructions  Anxiety is a normal reaction to stress. Difficult situations can cause you to have symptoms such as sweaty palms and a nervous feeling.  In an anxiety disorder, the symptoms are far more severe. Constant worry, muscle tension, trouble sleeping, nausea and diarrhea, and other symptoms can make normal daily activities difficult or impossible. These symptoms may occur for no reason, and they can affect your work, school, or social life. Medicines, counseling, and self-care can all help.  Follow-up care is a key part of your treatment and safety. Be sure to make and go to all appointments, and call your doctor if you are having problems. It's also a good idea to know your test results and keep a list of the medicines you take.  How can you care for yourself at home?  ?? Take medicines exactly as directed. Call your doctor if you think you are having a problem with your medicine.  ?? Go to your counseling sessions and follow-up appointments.  ?? Recognize and accept your anxiety. Then, when you are in a situation that makes you anxious, say to yourself, "This is not an emergency. I feel uncomfortable, but I am not in danger. I can keep going even if I feel anxious."  ?? Be kind to your body:  ?? Relieve tension with exercise or a massage.  ?? Get enough rest.  ?? Avoid alcohol, caffeine, nicotine, and illegal drugs. They can increase your anxiety level and cause sleep problems.  ?? Learn and do relaxation techniques. See below for more about these techniques.  ?? Engage your mind. Get out and do something you enjoy. Go to a funny movie, or take a walk or hike. Plan your day. Having too much or too little to do can make you anxious.  ?? Keep a record of your symptoms. Discuss your fears with a good friend or family member, or join a support group for people with similar problems. Talking to others sometimes relieves stress.  ?? Get involved in social groups, or volunteer  to help others. Being alone sometimes makes things seem worse than they are.  ?? Get at least 30 minutes of exercise on most days of the week to relieve stress. Walking is a good choice. You also may want to do other activities, such as running, swimming, cycling, or playing tennis or team sports.  Relaxation techniques  Do relaxation exercises 10 to 20 minutes a day. You can play soothing, relaxing music while you do them, if you wish.  ?? Tell others in your house that you are going to do your relaxation exercises. Ask them not to disturb you.  ?? Find a comfortable place, away from all distractions and noise.  ?? Lie down on your back, or sit with your back straight.  ?? Focus on your breathing. Make it slow and steady.  ?? Breathe in through your nose. Breathe out through either your nose or mouth.  ?? Breathe deeply, filling up the area between your navel and your rib cage. Breathe so that your belly goes up and down.  ?? Do not hold your breath.  ?? Breathe like this for 5 to 10 minutes. Notice the feeling of calmness throughout your whole body.  As you continue to breathe slowly and deeply, relax by doing the following for another 5 to 10 minutes:  ?? Tighten and relax each muscle group in your body. You can begin at your toes and work   your way up to your head.  ?? Imagine your muscle groups relaxing and becoming heavy.  ?? Empty your mind of all thoughts.  ?? Let yourself relax more and more deeply.  ?? Become aware of the state of calmness that surrounds you.  ?? When your relaxation time is over, you can bring yourself back to alertness by moving your fingers and toes and then your hands and feet and then stretching and moving your entire body. Sometimes people fall asleep during relaxation, but they usually wake up shortly afterward.  ?? Always give yourself time to return to full alertness before you drive a car or do anything that might cause an accident if you are not fully alert. Never play a relaxation tape while  you drive a car.  When should you call for help?  Call 911 anytime you think you may need emergency care. For example, call if:  ?? You feel you cannot stop from hurting yourself or someone else.  Watch closely for changes in your health, and be sure to contact your doctor if:  ?? You have anxiety or fear that affects your life.  ?? You have symptoms of anxiety that are new or different from those you had before.   Where can you learn more?   Go to http://www.healthwise.net/[facility]  Enter P754 in the search box to learn more about "Anxiety Disorder: After Your Visit."   ?? 2006-2015 Healthwise, Inc[employer]. Care instructions adapted under license by [facility] (which disclaims liability or warranty for this information). This care instruction is for use with your licensed healthcare professional. If you have questions about a medical condition or this instruction, always ask your healthcare professional. Healthwise, Incorporated disclaims any warranty or liability for your use of this information.  Content Version: 10.5.422740; Current as of: June 27, 2013

## 2014-03-27 NOTE — Progress Notes (Signed)
Medication refill.

## 2014-03-27 NOTE — Progress Notes (Signed)
Chief Complaint   Patient presents with   ??? Medication Refill     she is a 23 y.o. year old female who presents for evalution.    Pt here for anxiety F/U.  Tried Buspar but only for a month, does not remember if it was helpful.  Not having to take Clonazepam as frequently, maybe twice weekly, sometimes more.  No longer having panic attacks.  Doing well at current job.  Does feel overwhelmed with 2 small children, states feels still having low grade anxiety just not as severe as before.     Vyvanse working well, has been taking daily per pt but ran out a few weeks ago.  Feels well on current dosage.      Reviewed PmHx, RxHx, FmHx, SocHx, AllgHx and updated and dated in the chart.    Review of Systems - negative except as listed above in the HPI    Objective:     Filed Vitals:    03/27/14 0956   BP: 119/65   Pulse: 72   Temp: 98.2 ??F (36.8 ??C)   TempSrc: Oral   Resp: 16   Height: 5\' 5"  (1.651 m)   Weight: 139 lb 6.4 oz (63.231 kg)   SpO2: 99%     Physical Examination: General appearance - alert, well appearing, and in no distress  Mental status - alert, oriented to person, place, and time, anxious  Chest - clear to auscultation, no wheezes, rales or rhonchi, symmetric air entry  Heart - normal rate, regular rhythm, normal S1, S2, no murmurs, rubs, clicks or gallops    Assessment/ Plan:   [name] was seen today for medication refill.    Diagnoses and associated orders for this visit:    Attention deficit hyperactivity disorder (ADHD), unspecified ADHD type  - Discontinue: Lisdexamfetamine (VYVANSE) 50 mg capsule; Take 1 Cap by mouth every morning.  - Discontinue: Lisdexamfetamine (VYVANSE) 50 mg capsule; Take 1 Cap by mouth every morning. Ok to fill 04/26/14  - Lisdexamfetamine (VYVANSE) 50 mg capsule; Take 1 Cap by mouth every morning. Ok to fill 05/26/14  Reviewed PMP, pt taking appropriately.  Stable, cont current meds.  F/U 3 mo.     Anxiety  - clonazePAM (KLONOPIN) 0.5 mg tablet; Take 1 Tab by mouth two (2) times  daily as needed. Max Daily Amount: 1 mg.  - buPROPion XL (WELLBUTRIN XL) 150 mg tablet; Take 1 Tab by mouth every morning.  Trial of Wellbutrin, refill Clonazepam.  Only use as needed.  Discussed addictive properties of medication.  Should only use as needed and never share medication with others. F/U 1 mo      Follow-up Disposition:  Return in about 1 month (around 04/27/2014) for anxiety.    I have discussed the diagnosis with the patient and the intended plan as seen in the above orders.  The patient has received an after-visit summary and questions were answered concerning future plans.     Medication Side Effects and Warnings were discussed with patient    Adrian ProwsNancy E Kiandre Spagnolo, NP

## 2014-04-24 MED ORDER — BUPROPION 100 MG TAB
100 mg | ORAL_TABLET | Freq: Every day | ORAL | Status: DC
Start: 2014-04-24 — End: 2014-06-11

## 2014-04-24 MED ORDER — FLUCONAZOLE 150 MG TAB
150 mg | ORAL_TABLET | Freq: Every day | ORAL | Status: AC
Start: 2014-04-24 — End: 2014-04-25

## 2014-04-24 NOTE — Progress Notes (Signed)
Chief Complaint   Patient presents with   ??? Medication Evaluation   ??? Urinary Pain

## 2014-04-24 NOTE — Patient Instructions (Signed)
Drink 6 L of Pedialyte in next 4 hrs

## 2014-04-24 NOTE — Progress Notes (Signed)
Chief Complaint   Patient presents with   ??? Medication Evaluation   ??? Urinary Pain     she is a 23 y.o. year old female who presents for evalution.    Pt was donating plasma yesterday, had to stop halfway through and was nauseous, vomiting, dizzy.  Had to go to Pt first, was dx with dehydration and given Zofran and Vicoden.  Has been drinking Gatorade and water (2 of each since last night).  Has not urinated since last night.  Still feeling terrible, bad headache, fatigue.      Feeling better with Wellbutrin but having trouble sleeping, wonders if can change dosage.      Dysuria x 1 wk, frequency and urgency.  However, no urine since last night.  Does have thick, white vaginal discharge with itching.      Reviewed PmHx, RxHx, FmHx, SocHx, AllgHx and updated and dated in the chart.    Review of Systems - negative except as listed above in the HPI    Objective:     Filed Vitals:    04/24/14 1006   BP: 97/63   Pulse: 85   Temp: 98.3 ??F (36.8 ??C)   TempSrc: Oral   Resp: 18   Height:  (1.651 m)   Weight: 133 lb 11.2 oz (60.646 kg)   SpO2: 97%     Physical Examination: General appearance - alert, well appearing, and in no distress and ill-appearing  Mental status - alert, oriented to person, place, and time, anxious  Chest - clear to auscultation, no wheezes, rales or rhonchi, symmetric air entry  Heart - normal rate, regular rhythm, normal S1, S2, no murmurs, rubs, clicks or gallops  Pelvic - pt deferred  Skin - nevus on left hand, well defined, variable color    Assessment/ Plan:   [place] was seen today for medication evaluation and urinary pain.    Diagnoses and associated orders for this visit:    Dehydration  Increase fluids, 6 L of Pedialyte in next 4 hrs.  If no urine by tonight go to ER for IV fluids.  Pt voiced understanding.     Anxiety  - buPROPion (WELLBUTRIN) 100 mg tablet; Take 1 Tab by mouth daily.  Decrease dosage and switch from XR.  F/U 1 mo prn    Dysuria  Discussed since can't leave urine will  not treat at this time.     Vaginal discharge  - fluconazole (DIFLUCAN) 150 mg tablet; Take 1 Tab by mouth daily for 1 day. May repeat in 3 days if needed  F/U prn    Nevus of hand, left  - REFERRAL TO DERMATOLOGY  For removal     Follow-up Disposition:  Return if symptoms worsen or fail to improve.    I have discussed the diagnosis with the patient and the intended plan as seen in the above orders.  The patient has received an after-visit summary and questions were answered concerning future plans.     Medication Side Effects and Warnings were discussed with patient    Adrian Prows, NP

## 2014-05-01 MED ORDER — METRONIDAZOLE 500 MG TAB
500 mg | ORAL_TABLET | Freq: Two times a day (BID) | ORAL | Status: AC
Start: 2014-05-01 — End: 2014-05-08

## 2014-05-01 NOTE — Telephone Encounter (Signed)
Please inform pt rx sent to pharm. If no improvement after this then recommend RTC for vaginal swabs.   Thanks,  N

## 2014-05-01 NOTE — Telephone Encounter (Signed)
Pt called stating that her yeast infection has not gotten better and was told to call back if no improvement, and that Harriett Sine would call in something for BV. Pt uses [facility], and please advise pt when done @ [phone].

## 2014-05-04 NOTE — Telephone Encounter (Signed)
Patient id verified x 3. Patient notified as per provider and verbalized understanding.

## 2014-06-05 ENCOUNTER — Inpatient Hospital Stay: Admit: 2014-06-05 | Discharge: 2014-06-05 | Payer: MEDICAID | Attending: Emergency Medicine

## 2014-06-05 NOTE — ED Notes (Signed)
Triage Note:  [facility]  Pt does not go into more details during triage.

## 2014-06-05 NOTE — Other (Signed)
FNE out to waiting room to get patient, no answer when called.  FNE attempted to locate patient without success.  ED Charge made aware that patient may have left after checking in.  FNE contacted Sgt. [place]yl [place] [facility] [facility] and informed him of same.

## 2014-06-11 ENCOUNTER — Ambulatory Visit: Admit: 2014-06-11 | Discharge: 2014-06-11 | Payer: PRIVATE HEALTH INSURANCE | Attending: Family | Primary: Family

## 2014-06-11 DIAGNOSIS — F4310 Post-traumatic stress disorder, unspecified: Secondary | ICD-10-CM

## 2014-06-11 MED ORDER — CLONAZEPAM 0.5 MG TAB
0.5 mg | ORAL_TABLET | Freq: Two times a day (BID) | ORAL | Status: DC | PRN
Start: 2014-06-11 — End: 2014-07-01

## 2014-06-11 MED ORDER — BUPROPION SR 200 MG TAB
200 mg | ORAL_TABLET | Freq: Every day | ORAL | Status: AC
Start: 2014-06-11 — End: ?

## 2014-06-11 NOTE — Progress Notes (Signed)
Chief Complaint   Patient presents with   ??? Medication Refill
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
## 2014-06-15 ENCOUNTER — Encounter: Payer: Self-pay | Admitting: Obstetrics & Gynecology

## 2014-06-29 ENCOUNTER — Encounter: Attending: Family | Primary: Family

## 2014-07-01 ENCOUNTER — Ambulatory Visit: Admit: 2014-07-01 | Discharge: 2014-07-01 | Payer: PRIVATE HEALTH INSURANCE | Attending: Family | Primary: Family

## 2014-07-01 ENCOUNTER — Encounter: Attending: Family | Primary: Family

## 2014-07-01 DIAGNOSIS — F3162 Bipolar disorder, current episode mixed, moderate: Secondary | ICD-10-CM

## 2014-07-01 MED ORDER — LISDEXAMFETAMINE 50 MG CAPSULE
50 mg | ORAL_CAPSULE | ORAL | Status: DC
Start: 2014-07-01 — End: 2014-07-01

## 2014-07-01 MED ORDER — LAMOTRIGINE 25 MG (42)-100 MG (7) TABLETS IN A DOSE PACK
25 mg (42) -100 mg (7) | ORAL_TABLET | ORAL | Status: AC
Start: 2014-07-01 — End: ?

## 2014-07-01 MED ORDER — LISDEXAMFETAMINE 50 MG CAPSULE
50 mg | ORAL_CAPSULE | ORAL | Status: AC
Start: 2014-07-01 — End: ?

## 2014-07-01 MED ORDER — CLONAZEPAM 0.5 MG TAB
0.5 mg | ORAL_TABLET | Freq: Two times a day (BID) | ORAL | Status: AC | PRN
Start: 2014-07-01 — End: ?

## 2014-07-01 MED ORDER — METRONIDAZOLE 500 MG TAB
500 mg | ORAL_TABLET | Freq: Two times a day (BID) | ORAL | Status: AC
Start: 2014-07-01 — End: 2014-07-08

## 2014-07-01 MED ORDER — FLUCONAZOLE 150 MG TAB
150 mg | ORAL_TABLET | Freq: Every day | ORAL | Status: AC
Start: 2014-07-01 — End: 2014-07-02

## 2014-07-01 NOTE — Patient Instructions (Signed)
Bipolar Disorder: After Your Visit  Your Care Instructions  Bipolar disorder is an illness that causes extreme mood changes, from times of very high energy (manic episodes) to times of depression. But many people with bipolar disorder show only the symptoms of depression. These moods may cause problems with your work, school, family life, friendships, and how well you function.  This disease is also called manic-depression.  There is no cure for bipolar disorder, but it can be helped with medicines. Counseling may also help. It is important to take your medicines exactly as prescribed, even when you feel well. You may need lifelong treatment.  Follow-up care is a key part of your treatment and safety. Be sure to make and go to all appointments, and call your doctor if you are having problems. It's also a good idea to know your test results and keep a list of the medicines you take.  How can you care for yourself at home?  ?? Be safe with medicines. Take your medicines exactly as prescribed. Do not stop or change a medicine without talking to your doctor first. You and your doctor may need to try different combinations of medicines to find what works for you.  ?? Take your medicines on schedule to keep your moods even. When you feel good, you may think that you do not need your medicines. But it is important to keep taking them.  ?? Go to your counseling sessions. Call and talk with your counselor if you can't go to a session or if you don't think the sessions are helping. Do not just stop going.  ?? Get at least 30 minutes of activity on most days of the week. Walking is a good choice. You also may want to do other things, such as running, swimming, or cycling.  ?? Get enough sleep. Keep your room dark and quiet. Try to go to bed at the same time every night.  ?? Eat a healthy diet. This includes whole grains, dairy, fruits, vegetables, and protein. Eat foods from each of these groups.  ?? Try to lower your stress.  Manage your time, build a strong support system, and lead a healthy lifestyle. To lower your stress, try physical activity, slow deep breathing, or getting a massage.  ?? Do not use alcohol or illegal drugs.  ?? Learn the early signs of your mood changes. You can then take steps to help yourself feel better.  ?? Ask for help from friends and family when you need it. You may need help with daily chores when you are depressed. When you are manic, you may need support to control your high energy levels.  What should you do if someone in your family has bipolar disorder?  ?? Learn about the disease and the signs that it is getting worse.  ?? Remind your family member that you love him or her.  ?? Make a plan with all family members about how to take care of your loved one when his or her symptoms are bad.  ?? Talk about your fears and concerns and those of other family members. Seek counseling if needed.  ?? Do not focus attention only on the person who is in treatment.  ?? Remind yourself that it will take time for changes to occur.  ?? Do not blame yourself for the disease.  ?? Know your legal rights and the legal rights of your family member. Support groups or counselors can help you with this information.  ??   Take care of yourself. Keep up with your own interests, such as your career, hobbies, and friends. Use exercise, positive self-talk, deep breathing, and other relaxing exercises to help lower your stress.  ?? Give yourself time to grieve. You may need to deal with emotions such as anger, fear, and frustration. After you work through your feelings, you will be better able to care for yourself and your family.  ?? If you are having a hard time with your feelings or with your relationship with your family member, talk with a counselor.  When should you call for help?  Call 911 anytime you think you may need emergency care. For example, call if:  ?? You feel like hurting yourself or someone else.  ?? Someone who has bipolar  disorder displays dangerous behavior, and you think the person might hurt himself or herself or someone else.  Call your doctor now or seek immediate medical care if:  ?? You hear voices.  ?? Someone you know has bipolar disorder and talks about suicide. Keep the numbers for these national suicide hotlines: 1-800-273-TALK (629) 286-5249(1-934 357 3436) and 1-800-SUICIDE 938-046-7347(1-(939)388-6004). If a suicide threat seems real, with a specific plan and a way to carry it out, stay with the person, or ask someone you trust to stay with the person, until you can get help.  ?? Someone you know has bipolar disorder and:  ?? Starts to give away possessions.  ?? Is using illegal drugs or drinking alcohol heavily.  ?? Talks or writes about death, including writing suicide notes or talking about guns, knives, or pills.  ?? Talks or writes about hurting someone else.  ?? Starts to spend a lot of time alone.  ?? Acts very aggressively or suddenly appears calm.  ?? Talks about beliefs that are not based in reality (delusions).  Watch closely for changes in your health, and be sure to contact your doctor if:  ?? You cannot go to your counseling sessions.   Where can you learn more?   Go to [email or web address]://www.healthwise.net/[facility]  Enter K052 in the search box to learn more about "Bipolar Disorder: After Your Visit."   ?? 2006-2015 Healthwise, Inc[employer]. Care instructions adapted under license by [employer] (which disclaims liability or warranty for this information). This care instruction is for use with your licensed healthcare professional. If you have questions about a medical condition or this instruction, always ask your healthcare professional. Healthwise, Incorporated disclaims any warranty or liability for your use of this information.  Content Version: 10.5.422740; Current as of: June 27, 2013

## 2014-07-01 NOTE — Progress Notes (Signed)
Chief Complaint   Patient presents with   ??? Medication Refill     she is a 23 y.o. year old female who presents for evalution.    Pt feel better on Wellbutrin.  However, still having a lot of emotional lability.  Some days will feel ok, some days will have a hard time - changes seem extreme and sudden.  Unsure if has been worsening with Wellbutrin.  Worried she may have bipolar disorder.  States does make a lot of reckless decision with little regard for personal welfare or welfare of others, gets into moderate amount of trouble.      Having vaginal odor with no discharge.  Believes has BV.  However, usually after treatment gets yeast infection - requesting rx for both.  Just had STD testing at [facility] a week ago.     Needs refill on ADD meds, doing well on current medication.     Reviewed PmHx, RxHx, FmHx, SocHx, AllgHx and updated and dated in the chart.    Review of Systems - negative except as listed above in the HPI    Objective:     Filed Vitals:    07/01/14 0802   BP: 113/74   Pulse: 79   Temp: 98.3 ??F (36.8 ??C)   TempSrc: Oral   Resp: 18   Height: $Remove'5\' 5"'cXpkmCR$  (1.651 m)   Weight: 128 lb 6.4 oz (58.242 kg)   SpO2: 98%     Physical Examination: General appearance - alert, well appearing, and in no distress  Mental status - alert, oriented to person, place, and time, depressed mood, anxious  Chest - clear to auscultation, no wheezes, rales or rhonchi, symmetric air entry  Heart - normal rate, regular rhythm, normal S1, S2, no murmurs, rubs, clicks or gallops  Pelvic - pt deferred    Assessment/ Plan:   Stacey Sexton was seen today for medication refill.    Diagnoses and all orders for this visit:    Bipolar disorder, current episode mixed, moderate (HCC)  Orders:  -     lamoTRIgine (LAMICTAL STARTER, ORANGE, KIT) 25 mg (42) -100 mg (7) DsPk; Take 1 Tab by mouth 12-DAY PREDNISONE DOSEPACK.  -     clonazePAM (KLONOPIN) 0.5 mg tablet; Take 1 Tab by mouth two (2) times daily as needed. Max Daily Amount: 1 mg. Ok to fill  07/09/14  New rx.  Cont current meds.  F/U 1 mo.    Anxiety  Orders:  -     clonazePAM (KLONOPIN) 0.5 mg tablet; Take 1 Tab by mouth two (2) times daily as needed. Max Daily Amount: 1 mg. Ok to fill 07/09/14  Refills provided.      PTSD (post-traumatic stress disorder)  Orders:  -     clonazePAM (KLONOPIN) 0.5 mg tablet; Take 1 Tab by mouth two (2) times daily as needed. Max Daily Amount: 1 mg. Ok to fill 07/09/14  Refill provided.      Vaginal odor  Orders:  -     metroNIDAZOLE (FLAGYL) 500 mg tablet; Take 1 Tab by mouth two (2) times a day for 7 days.  -     fluconazole (DIFLUCAN) 150 mg tablet; Take 1 Tab by mouth daily for 1 day. May repeat in 3 days if needed  New rx.  F/U prn    Attention deficit hyperactivity disorder (ADHD), unspecified ADHD type  Orders:  -     Discontinue: Lisdexamfetamine (VYVANSE) 50 mg capsule; Take 1 Cap by mouth every morning.  -  Discontinue: Lisdexamfetamine (VYVANSE) 50 mg capsule; Take 1 Cap by mouth every morning. Ok to fill 07/30/14  -     Lisdexamfetamine (VYVANSE) 50 mg capsule; Take 1 Cap by mouth every morning. Ok to fill 08/29/14   Stable, cont current meds.  Given 3 mo supply, F/U 3 mo.      Check PMP.  Pt taking meds appropriately.     Pt voiced understanding regarding plan of care.     Follow-up Disposition:  Return in about 1 month (around 07/31/2014) for Anxiety F/U and labs .    I have discussed the diagnosis with the patient and the intended plan as seen in the above orders.  The patient has received an after-visit summary and questions were answered concerning future plans.     Medication Side Effects and Warnings were discussed with patient    Carmine Savoy, NP

## 2014-07-01 NOTE — Progress Notes (Signed)
Chief Complaint   Patient presents with   ??? Medication Refill

## 2014-07-02 NOTE — Progress Notes (Signed)
[facility] [phone].

## 2014-09-27 ENCOUNTER — Emergency Department (HOSPITAL_COMMUNITY)
Admission: EM | Admit: 2014-09-27 | Discharge: 2014-09-27 | Disposition: A | Discharge status: Home / Self Care | Payer: Medicaid Other | Attending: Emergency Medicine | Admitting: Emergency Medicine

## 2014-09-27 ENCOUNTER — Encounter (HOSPITAL_COMMUNITY): Payer: Self-pay | Admitting: Physical Medicine and Rehabilitation

## 2014-09-27 DIAGNOSIS — R319 Hematuria, unspecified: Secondary | ICD-10-CM | POA: Diagnosis not present

## 2014-09-27 DIAGNOSIS — N898 Other specified noninflammatory disorders of vagina: Secondary | ICD-10-CM

## 2014-09-27 DIAGNOSIS — R300 Dysuria: Secondary | ICD-10-CM | POA: Diagnosis not present

## 2014-09-27 LAB — URINALYSIS, ROUTINE W REFLEX MICROSCOPIC
Glucose, UA: NEGATIVE mg/dL
Ketones, ur: NEGATIVE mg/dL
Leukocytes, UA: NEGATIVE
Nitrite: NEGATIVE
Protein, ur: NEGATIVE mg/dL
Specific Gravity, Urine: 1.03 (ref 1.005–1.030)
Urobilinogen, UA: 1 mg/dL (ref 0.0–1.0)
pH: 6.5 (ref 5.0–8.0)

## 2014-09-27 LAB — WET PREP, GENITAL
Trich, Wet Prep: NONE SEEN
Yeast Wet Prep HPF POC: NONE SEEN

## 2014-09-27 LAB — RAPID HIV SCREEN (WH-MAU): Rapid HIV Screen: NONREACTIVE

## 2014-09-27 LAB — URINE MICROSCOPIC-ADD ON

## 2014-09-27 LAB — POC URINE PREG, ED: Preg Test, Ur: NEGATIVE

## 2014-09-27 MED ORDER — CEFTRIAXONE SODIUM 250 MG IJ SOLR
250.0000 mg | Freq: Once | INTRAMUSCULAR | Status: AC
  Administered 2014-09-27: 250 mg via INTRAMUSCULAR
  Filled 2014-09-27: qty 250

## 2014-09-27 MED ORDER — AZITHROMYCIN 250 MG PO TABS
1000.0000 mg | ORAL_TABLET | Freq: Once | ORAL | Status: AC
  Administered 2014-09-27: 1000 mg via ORAL
  Filled 2014-09-27: qty 4

## 2014-09-27 MED ORDER — LIDOCAINE HCL (PF) 1 % IJ SOLN
5.0000 mL | Freq: Once | INTRAMUSCULAR | Status: AC
  Administered 2014-09-27: 5 mL
  Filled 2014-09-27: qty 5

## 2014-09-27 NOTE — Discharge Instructions (Signed)
Return here as needed. Follow up with your doctor. °

## 2014-09-27 NOTE — ED Notes (Signed)
Pt presents to department for evaluation of foul smelling yellow colored vaginal discharge, also states dysuria, and hematuria. Ongoing x1 week. Pt is alert and oriented x4.

## 2014-09-27 NOTE — ED Notes (Signed)
Pt. Requesting STD testing.

## 2014-09-27 NOTE — ED Provider Notes (Signed)
CSN: [id number]     Arrival date & time 09/27/14  1643 History   First MD Initiated Contact with Patient 09/27/14 1723     Chief Complaint  Patient presents with  . Vaginal Discharge  . Dysuria  . Hematuria     (Consider location/radiation/quality/duration/timing/severity/associated sxs/prior Treatment) HPI   Patient presents today complaining of dysuria x 2 weeks with increased urinary frequency, vaginal discharge with foul odor accompanied by suprapubic abdominal pain.  Patient denies, abdominal pain, weakness, dizziness, headache, blurred vision, numbness, lightheadedness, syncope fever, chills, N/V, hematuria, flank pain, SOB, and CP.  Patient is sexually active and would like to be tested today.  Patient states nothing seems make her condition better or worse  Past Medical History  Diagnosis Date  . No pertinent past medical history    Past Surgical History  Procedure Laterality Date  . Wisdom tooth extraction     Family History  Problem Relation Age of Onset  . Hypertension Father    History  Substance Use Topics  . Smoking status: Current Every Day Smoker -- 0.00 packs/day    Types: Cigarettes    Last Attempt to Quit: 04/20/2011  . Smokeless tobacco: Never Used  . Alcohol Use: No   OB History    Gravida Para Term Preterm AB TAB SAB Ectopic Multiple Living   3 2 2  0 1 0 1 0 0 2     Review of Systems All other systems negative except as documented in the HPI. All pertinent positives and negatives as reviewed in the HPI.   Allergies  Review of patient's allergies indicates no known allergies.  Home Medications   Prior to Admission medications   Medication Sig Start Date End Date Taking? Authorizing Provider  calcium carbonate (TUMS - DOSED IN MG ELEMENTAL CALCIUM) 500 MG chewable tablet Chew 2 tablets by mouth daily as needed. For heartburn    Historical Provider, MD  Prenatal Vit-Fe Fumarate-FA (MULTIVITAMIN-PRENATAL) 27-0.8 MG TABS Take 1 tablet by mouth  daily.    Historical Provider, MD  sertraline (ZOLOFT) 25 MG tablet Take 1 tablet (25 mg total) by mouth daily. 03/06/12 03/06/13  Willodean Rosenthalarolyn Harraway-Smith, MD   BP 124/78 mmHg  Pulse 77  Temp(Src) 98.2 F (36.8 C) (Oral)  Resp 22  SpO2 97% Physical Exam  Constitutional: She is oriented to person, place, and time. She appears well-developed and well-nourished. No distress.  HENT:  Head: Normocephalic and atraumatic.  Eyes: Conjunctivae and EOM are normal. Pupils are equal, round, and reactive to light.  Neck: Normal range of motion. Neck supple.  Cardiovascular: Normal rate, regular rhythm and normal heart sounds.   No murmur heard. Pulmonary/Chest: Effort normal and breath sounds normal.  Abdominal: Soft. Bowel sounds are normal. She exhibits no distension. There is tenderness in the suprapubic area. There is no rigidity, no rebound, no guarding and no CVA tenderness.  Genitourinary: Vagina normal. There is no tenderness or lesion on the right labia. There is no tenderness or lesion on the left labia. Cervix exhibits discharge and friability. Cervix exhibits no motion tenderness. Right adnexum displays no tenderness. Left adnexum displays no tenderness. No erythema, tenderness or bleeding in the vagina. No vaginal discharge found.  Neurological: She is alert and oriented to person, place, and time.  Skin: Skin is warm, dry and intact.  Psychiatric: She has a normal mood and affect. Her speech is normal.    ED Course  Procedures (including critical care time) Labs Review Labs Reviewed  URINALYSIS, ROUTINE  W REFLEX MICROSCOPIC  POC URINE PREG, ED      MDM   Final diagnoses:  Vaginal discharge    Patient presents with dysuria and suprapubic pain x 2 weeks.  Sexually active.  HIV, UA, hcg negative.  Wet prep demonstrates few WBC.  Physical exam significant for suprapubic tenderness and cervical discharge and friability.  Patient will be treated for STD and told to follow-up with her  GYN doctor.  Also advised her that her partners may need to be tested and treated    Carlyle Dolly, PA-C 09/28/14 0041  Richardean Canal, MD 09/28/14 [phone]

## 2014-09-27 NOTE — ED Notes (Signed)
Lab at bedside

## 2014-09-27 NOTE — ED Notes (Signed)
Pelvic cart set up at bedside  

## 2014-09-28 LAB — GC/CHLAMYDIA PROBE AMP (~~LOC~~) NOT AT ARMC
Chlamydia: NEGATIVE
Neisseria Gonorrhea: NEGATIVE

## 2014-09-29 LAB — RPR: RPR Ser Ql: NONREACTIVE

## 2014-11-18 ENCOUNTER — Encounter (HOSPITAL_COMMUNITY): Payer: Self-pay | Admitting: *Deleted

## 2014-11-18 ENCOUNTER — Emergency Department
Admit: 2014-11-18 | Disposition: A | Discharge status: Other Acute Inpatient Hospital | Payer: Self-pay | Admitting: Emergency Medicine

## 2014-11-18 ENCOUNTER — Emergency Department (HOSPITAL_COMMUNITY)
Admission: EM | Admit: 2014-11-18 | Discharge: 2014-11-18 | Disposition: A | Discharge status: Home / Self Care | Payer: MEDICAID | Attending: Emergency Medicine | Admitting: Emergency Medicine

## 2014-11-18 DIAGNOSIS — F1110 Opioid abuse, uncomplicated: Secondary | ICD-10-CM

## 2014-11-18 DIAGNOSIS — Z79899 Other long term (current) drug therapy: Secondary | ICD-10-CM | POA: Insufficient documentation

## 2014-11-18 DIAGNOSIS — Z720 Tobacco use: Secondary | ICD-10-CM | POA: Insufficient documentation

## 2014-11-18 LAB — COMPREHENSIVE METABOLIC PANEL
Albumin: 4.3 g/dL
Alkaline Phosphatase: 75 U/L
Anion Gap: 7 (ref 7–16)
BUN: 8 mg/dL
Bilirubin,Total: 0.5 mg/dL
Calcium, Total: 9.5 mg/dL
Chloride: 101 mmol/L
Co2: 32 mmol/L
Creatinine: 0.75 mg/dL
EGFR (African American): >60
EGFR (Non-African Amer.): >60
Glucose: 62 mg/dL — ABNORMAL LOW
Potassium: 3.5 mmol/L
SGOT(AST): 16 U/L
SGPT (ALT): 13 U/L — ABNORMAL LOW
Sodium: 140 mmol/L
Total Protein: 7.4 g/dL

## 2014-11-18 LAB — DRUG SCREEN, URINE
Amphetamines, Ur Screen: NEGATIVE
Barbiturates, Ur Screen: NEGATIVE
Benzodiazepine, Ur Scrn: NEGATIVE
Cannabinoid 50 Ng, Ur ~~LOC~~: NEGATIVE
Cocaine Metabolite,Ur ~~LOC~~: NEGATIVE
MDMA (Ecstasy)Ur Screen: NEGATIVE
Methadone, Ur Screen: NEGATIVE
Opiate, Ur Screen: POSITIVE
Phencyclidine (PCP) Ur S: NEGATIVE
Tricyclic, Ur Screen: NEGATIVE

## 2014-11-18 LAB — URINALYSIS, COMPLETE
Bilirubin,UR: NEGATIVE
Glucose,UR: NEGATIVE mg/dL (ref 0–75)
Hyaline Cast: 2
Ketone: NEGATIVE
Nitrite: NEGATIVE
Ph: 7 (ref 4.5–8.0)
Protein: NEGATIVE
RBC,UR: 2 /HPF (ref 0–5)
Specific Gravity: 1.006 (ref 1.003–1.030)
Squamous Epithelial: 26
WBC UR: 2 /HPF (ref 0–5)

## 2014-11-18 LAB — CBC
HCT: 41.7 % (ref 35.0–47.0)
HGB: 14.2 g/dL (ref 12.0–16.0)
MCH: 32.7 pg (ref 26.0–34.0)
MCHC: 34 g/dL (ref 32.0–36.0)
MCV: 96 fL (ref 80–100)
Platelet: 259 10*3/uL (ref 150–440)
RBC: 4.34 10*6/uL (ref 3.80–5.20)
RDW: 12.3 % (ref 11.5–14.5)
WBC: 13.1 10*3/uL — ABNORMAL HIGH (ref 3.6–11.0)

## 2014-11-18 LAB — ACETAMINOPHEN LEVEL: Acetaminophen: <10 ug/mL

## 2014-11-18 LAB — SALICYLATE LEVEL: Salicylates, Serum: <4 mg/dL

## 2014-11-18 LAB — ETHANOL: Ethanol: <5 mg/dL

## 2014-11-18 NOTE — ED Provider Notes (Signed)
CSN: [id number]     Arrival date & time 11/18/14  1611 History   First MD Initiated Contact with Patient 11/18/14 1620     Chief Complaint  Patient presents with  . 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 Pt states that she is not si/hi. Pt states that she wants inpt detox. Pt states that she had not use in 24 hours but then she started to vomit and so she used again a short time ago.  The history is provided by the patient. No language interpreter was used.    Past Medical History  Diagnosis Date  . No pertinent past medical history    Past Surgical History  Procedure Laterality Date  . Wisdom tooth extraction     Family History  Problem Relation Age of Onset  . Hypertension Father    History  Substance Use Topics  . Smoking status: Current Every Day Smoker -- 0.00 packs/day    Types: Cigarettes    Last Attempt to Quit: 04/20/2011  . Smokeless tobacco: Never Used  . Alcohol Use: No   OB History    Gravida Para Term Preterm AB TAB SAB Ectopic Multiple Living   3 2 2  0 1 0 1 0 0 2     Review of Systems  All other systems reviewed and are negative.     Allergies  Review of patient's allergies indicates no known allergies.  Home Medications   Prior to Admission medications   Medication Sig Start Date End Date Taking? Authorizing Provider  sertraline (ZOLOFT) 25 MG tablet Take 1 tablet (25 mg total) by mouth daily. 03/06/12 03/06/13  Willodean Rosenthalarolyn Harraway-Smith, MD   BP 131/74 mmHg  Pulse 74  Temp(Src) 98.4 F (36.9 C) (Oral)  Resp 16  SpO2 100%  LMP 11/08/2014 (Exact Date) Physical Exam  Constitutional: She is oriented to person, place, and time. She appears well-developed and well-nourished.  Cardiovascular: Normal rate and regular rhythm.   Pulmonary/Chest: Effort normal.  Musculoskeletal: Normal range of motion.   Neurological: She is alert and oriented to person, place, and time.  Skin: Skin is warm and dry.  Nursing note and vitals reviewed.   ED Course  Procedures (including critical care time) Labs Review Labs Reviewed - No data to display  Imaging Review No results found.   EKG Interpretation None      MDM   Final diagnoses:  Heroin abuse    Discussed resources with pt. Pt not si/hi    Teressa LowerVrinda Jeannett Dekoning, NP 11/18/14 1636  Raeford RazorStephen Kohut, MD 11/18/14 2321

## 2014-11-18 NOTE — Discharge Instructions (Signed)
Opioid Withdrawal °Opioids are a group of narcotic drugs. They include the street drug heroin. They also include pain medicines, such as morphine, hydrocodone, oxycodone, and fentanyl. Opioid withdrawal is a group of characteristic physical and mental signs and symptoms. It typically occurs if you have been using opioids daily for several weeks or longer and stop using or rapidly decrease use. Opioid withdrawal can also occur if you have used opioids daily for a long time and are given a medicine to block the effect.  °SIGNS AND SYMPTOMS °Opioid withdrawal includes three or more of the following symptoms:  °· Depressed, anxious, or irritable mood. °· Nausea or vomiting. °· Muscle aches or spasms.   °· Watery eyes.    °· Runny nose. °· Dilated pupils, sweating, or hairs standing on end. °· Diarrhea or intestinal cramping. °· Yawning.   °· Fever. °· Increased blood pressure. °· Fast pulse. °· Restlessness or trouble sleeping. °These signs and symptoms occur within several hours of stopping or reducing short-acting opioids, such as heroin. They can occur within 3 days of stopping or reducing long-acting opioids, such as methadone. Withdrawal begins within minutes of receiving a drug that blocks the effects of opioids, such as naltrexone or naloxone. °DIAGNOSIS  °Opioid use disorder is diagnosed by your health care provider. You will be asked about your symptoms, drug and alcohol use, medical history, and use of medicines. A physical exam may be done. Lab tests may be ordered. Your health care provider may have you see a mental health professional.  °TREATMENT  °The treatment for opioid withdrawal is usually provided by medical doctors with special training in substance use disorders (addiction specialists). The following medicines may be included in treatment: °· Opioids given in place of the abused opioid. They turn on opioid receptors in the brain and lessen or prevent withdrawal symptoms. They are gradually  decreased (opioid substitution and taper). °· Non-opioids that can lessen certain opioid withdrawal symptoms. They may be used alone or with opioid substitution and taper. °Successful long-term recovery usually requires medicine, counseling, and group support. °HOME CARE INSTRUCTIONS  °· Take medicines only as directed by your health care provider. °· Check with your health care provider before starting new medicines. °· Keep all follow-up visits as directed by your health care provider. °SEEK MEDICAL CARE IF: °· You are not able to take your medicines as directed. °· Your symptoms get worse. °· You relapse. °SEEK IMMEDIATE MEDICAL CARE IF: °· You have serious thoughts about hurting yourself or others. °· You have a seizure. °· You lose consciousness. °Document Released: 08/03/2003 Document Revised: 12/15/2013 Document Reviewed: 08/13/2013 °ExitCare® Patient Information ©2015 [employer]. This information is not intended to replace advice given to you by your health care provider. Make sure you discuss any questions you have with your health care provider. ° °Emergency Department Resource Guide °1) Find a Doctor and Pay Out of Pocket °Although you won't have to find out who is covered by your insurance plan, it is a good idea to ask around and get recommendations. You will then need to call the office and see if the doctor you have chosen will accept you as a new patient and what types of options they offer for patients who are self-pay. Some doctors offer discounts or will set up payment plans for their patients who do not have insurance, but you will need to ask so you aren't surprised when you get to your appointment. ° °2) Contact Your Local Health Department °Not all health departments have   doctors that can see patients for sick visits, but many do, so it is worth a call to see if yours does. If you don't know where your local health department is, you can check in your phone book. The CDC also has a tool to  help you locate your state's health department, and many state websites also have listings of all of their local health departments. ° °3) Find a Walk-in Clinic °If your illness is not likely to be very severe or complicated, you may want to try a walk in clinic. These are popping up all over the country in pharmacies, drugstores, and shopping centers. They're usually staffed by nurse practitioners or physician assistants that have been trained to treat common illnesses and complaints. They're usually fairly quick and inexpensive. However, if you have serious medical issues or chronic medical problems, these are probably not your best option. ° °No Primary Care Doctor: °- Call [facility] at  832-8000 - they can help you locate a primary care doctor that  accepts your insurance, provides certain services, etc. °- Physician Referral Service- 1-800-533-3463 ° °Chronic Pain Problems: °Organization         Address  Phone   Notes  °[facility]  (336) 297-2271 Patients need to be referred by their primary care doctor.  ° °Medication Assistance: °Organization         Address  Phone   Notes  °[place] [facility] [address] °[place], [place] [address] (336) 641-8030 --Must be a resident of [place] °-- Must have NO insurance coverage whatsoever (no Medicaid/ Medicare, etc.) °-- The pt. MUST have a primary care doctor that directs their care regularly and follows them in the community °  °[facility]  (866) 331-1348   °[employer]  (888) 892-1162   ° °Agencies that provide inexpensive medical care: °Organization         Address  Phone   Notes  °[facility]  (336) 832-8035   °[facility]    (336) 832-7272   °[facility] [address] °[place], [place] [address] (336) 832-4777   °[facility] [address], °[place] (336) 271-4999   °[facility]    (336) 373-0678   °[facility]    (336) 272-1050   °[facility] ° [address], [place] Phone:  (336) 832-4444, Fax:  (336) 832-4440 Hours of Operation:  9 am - 6 pm, M-F.  Also accepts Medicaid/Medicare and self-pay.  °[facility] ° [address], [place] Phone: (336) 832-3150, Fax: (336) [phone]. Hours of Operation:  8:30 am - 5:30 pm, M-F.  Also accepts Medicaid and self-pay.  °[facility] [place] [address], [place] Phone: (336) 878-6027   °[facility] [address], [place], [place] (336)723-1848, Ext. 123 Mondays & Thursdays: 7-9 AM.  First 15 patients are seen on a first come, first serve basis. °  ° °Medicaid-accepting [place] Providers: ° °Organization         Address  Phone   Notes  °[facility] [address], [place] (336) 641-2100 Also accepts self-pay patients.  °[facility] [address], [place] ° (336) 856-9996   °[facility] [address], [place] (336) 288-8857   °[facility] [address]   [place] [address], [place] (336) 299-7000   °Veita Bland [address], [place]  ° (336) 373-1557 Only accepts [place] Access Medicaid patients after they have their name applied to their card.  ° °Self-Pay (no insurance) in [place]: ° °Organization         Address  Phone   Notes  °Sickle Cell Patients, [facility] [address], [place] (336) 832-1970   °[facility] [address], [place] (336) 832-4400   °[facility] ° [address], [place] (336) 992-4800   °[facility]/Dr. Osei-Bonsu ° [address] [place] [address], [place] or [address], [place] (336) 841-8500 Phone number for both [place] and [place] locations is the same.  °[facility] [address],  [place] (336) 299-0000   °[facility] [address] [place] [address], [place] or [address] (336) 852-7530 °(336) 878-2260   °[facility] [address], [place] (336) 350-1642, phone; (336) 294-5005, fax Sees patients 1st and 3rd Saturday of every month.  Must not qualify for public or private insurance (i.e. Medicaid, Medicare, [place] Health Choice, Veterans' Benefits) • Household income should be no more than 200% of the poverty level •The clinic cannot treat you if you are pregnant or think you are pregnant • Sexually transmitted diseases are not treated at the clinic.  ° ° °Dental Care: °Organization         Address  Phone  Notes  °[facility] [address], [place] (336) 641-6152 Accepts children up to age 21 who are enrolled in Medicaid or [place] Health Choice; pregnant women with a Medicaid card; and children who have applied for Medicaid or [place] Health Choice, but were declined, whose parents can pay a reduced fee at time of service.  °[facility] [place]  [address], [place] (336) 641-7733 Accepts children up to age 21 who are enrolled in Medicaid or [place] Health Choice; pregnant women with a Medicaid card; and children who have applied for Medicaid or [place] Health Choice, but were declined, whose parents can pay a reduced fee at time of service.  °[facility] ° [address], [place] (336) 641-4533 Patients are seen by appointment only. Walk-ins are not accepted. [facility] will see patients 18 years of age and older. °Monday - Tuesday (8am-5pm) °Most Wednesdays (8:30-5pm) °$30 per visit, cash only  °[facility] ° [address], [place] (336) 641-4533 Patients are seen by appointment only. Walk-ins are not accepted. [facility] will see patients 18 years of age and older. °One Wednesday Evening  (Monthly: Volunteer Based).  $30 per visit, cash only  °[facility]  (919) 537-3737 for adults; Children under age 4, call Graduate Pediatric Dentistry at (919) [phone]. Children aged 4-14, please call (919) [phone] to request a pediatric application. ° Dental services are provided in all areas of dental care including fillings, crowns and bridges, complete and partial dentures, implants, gum treatment, root canals, and extractions. Preventive care is also provided. Treatment is provided to both adults and children. °Patients are selected via a lottery and there is often a waiting list. °  °[facility] [address], ° ° (336) 763-8833 [email or web address] °  °[facility] [address], [place], [place] (336)723-1848, Ext. 123 Second and Fourth Thursday of each month, opens at 6:30 AM; Clinic ends at 9 AM.  Patients are seen on a first-come first-served basis, and a limited number are seen during each clinic.  ° °[facility] ° [address], [place], [place] (336) 723-7904   Eligibility Requirements °You must have lived in [place], [place], or [place] counties for at least the last three months. °  You cannot be eligible for state or federal sponsored healthcare insurance, including Veterans Administration, Medicaid, or Medicare. °  You generally cannot be eligible for healthcare insurance through your employer.  °  How to apply: °Eligibility screenings are held every Tuesday and Wednesday afternoon from 1:00 pm until 4:00 pm. You do not need an appointment for the interview!  °[facility] [address], [place], [place] 336-631-2330   °[facility]  336-342-8273   °[facility]  336-703-3100   °[facility]  336-570-6415   ° °Behavioral Health Resources in the Community: °Intensive Outpatient Programs °Organization         Address  Phone  Notes  °[place]  [facility] [address], [place], [place] 336-878-6098   °[facility] [address], [place], [place] 336-832-9800   °[facility] [address], [place], [place] ° 336-882-2125   °[facility] [address],  °[place], [place] 1-800-853-5163 or 336-641-4981   °Substance Abuse Resources °Organization         Address  Phone  Notes  °[facility]  336-882-2125   °[facility]  336-784-9470   °[facility]  336-285-9073   °[facility]  336-845-3988   °Residential & Outpatient Substance Abuse Program  1-800-659-3381   °Psychological Services °Organization         Address  Phone  Notes  °[facility]  336- 832-9600   °[facility]  336- 378-7881   °[facility] [address], [place] 1-800-853-5163 or 336-641-4981   ° °Mobile Crisis Teams °Organization         Address  Phone  Notes  °[facility], Mobile Crisis Care Unit  1-877-626-1772   °[facility]ive °[facility] ° [address] [place], [place] 336-834-9664   °Sharon DeEsch [address] °[place] 336-554-5454   ° °Self-Help/Support Groups °Organization         Address  Phone             Notes  °[facility] [place] - variety of support groups  336- 373-1402 Call for more information  °Narcotics Anonymous (NA), [facility] [address], °[place]  2 meetings at this location  ° °Residential Treatment Programs °Organization         Address  Phone  Notes  °[facility] [address],    °[place]  1-866-801-8205   °[facility] ° [address], [place], [place] 704-293-8524   °[facility] [address], [place] 336-845-3988 Admissions: 8am-3pm M-F  °[facility] [address]    °[place], [place] 336-841-1104   °[facility] [address],  [place], [place] 336-379-7146   °[facility] [address]  °[place],  336-285-9073   °[facility] Intensive Outpatient [address], [place],   [place] 336-852-3033   °[facility] [address]  °[place], [place] 1-877-615-2722 or 336-784-9470   °[facility] [address] [place], [place] 336-227-7417 Accepts Medicaid  °[facility] [address]  °[place] 1-800-659-3381 Substance Abuse/Addiction Treatment  ° °[place] Behavioral Health Resources °Organization         Address  Phone  Notes  °[facility]  (888) 581-9988   °Julie Brannon, PhD [address] [place], [place]   (336) 349-5553 or (336) 951-0000   °[facility]   [address] °[place], [place] (336) 349-4454   °[facility] [address], [place], [place] (336) 342-8316 Insurance/Medicaid/sponsorship through [facility]  °[facility] [address]                                    [place], [place] (336) 342-8316 Therapy/tele-psych/case  °[facility] [address]  ° [place], [place] (336) 349-2233    °Dr. Arfeen  (336) 349-4544   °[facility] [place]  [employer] [place] [facility] 1) [address], [place] °2) [address], [place] °3)  [address], [place] (336) 349-3220 °(336) 342-7768 ° °(336) 342-8140   °[place] [facility] (336) 342-1394 or (336) 342-3537 (After Hours)    ° ° °

## 2014-11-18 NOTE — ED Notes (Signed)
Pt requesting heroine detox. Denies SI/HI. rn asked two different times, pt denied SI/HI both times. Denies pain.

## 2014-11-18 NOTE — ED Notes (Signed)
Pt escorted to discharge window. Pt verbalized understanding discharge instructions. In no acute distress.  

## 2014-11-26 ENCOUNTER — Encounter (HOSPITAL_COMMUNITY): Payer: Self-pay | Admitting: Emergency Medicine

## 2014-11-26 ENCOUNTER — Emergency Department (INDEPENDENT_AMBULATORY_CARE_PROVIDER_SITE_OTHER)
Admission: EM | Admit: 2014-11-26 | Discharge: 2014-11-26 | Disposition: A | Discharge status: Home / Self Care | Payer: Medicaid Other | Source: Home / Self Care | Attending: Family Medicine | Admitting: Family Medicine

## 2014-11-26 DIAGNOSIS — R6889 Other general symptoms and signs: Secondary | ICD-10-CM

## 2014-11-26 DIAGNOSIS — N3010 Interstitial cystitis (chronic) without hematuria: Secondary | ICD-10-CM | POA: Diagnosis not present

## 2014-11-26 HISTORY — DX: Depression, unspecified: F32.A

## 2014-11-26 HISTORY — DX: Major depressive disorder, single episode, unspecified: F32.9

## 2014-11-26 LAB — POCT URINALYSIS DIP (DEVICE)
Bilirubin Urine: NEGATIVE
Glucose, UA: NEGATIVE mg/dL
Ketones, ur: NEGATIVE mg/dL
Leukocytes, UA: NEGATIVE
Nitrite: NEGATIVE
Protein, ur: NEGATIVE mg/dL
Specific Gravity, Urine: 1.015 (ref 1.005–1.030)
Urobilinogen, UA: 0.2 mg/dL (ref 0.0–1.0)
pH: 7 (ref 5.0–8.0)

## 2014-11-26 LAB — POCT PREGNANCY, URINE: Preg Test, Ur: NEGATIVE

## 2014-11-26 MED ORDER — MELOXICAM 15 MG PO TABS: 7.5000 mg | ORAL_TABLET | Freq: Every day | ORAL | Status: DC

## 2014-11-26 MED ORDER — PHENAZOPYRIDINE HCL 100 MG PO TABS: 100.0000 mg | ORAL_TABLET | Freq: Three times a day (TID) | ORAL | Status: DC | PRN

## 2014-11-26 MED ORDER — ONDANSETRON HCL 4 MG PO TABS: 4.0000 mg | ORAL_TABLET | Freq: Three times a day (TID) | ORAL | Status: DC | PRN

## 2014-11-26 NOTE — ED Notes (Signed)
C/o  Chills.  Up set stomach.  N/v.   Denies fever but having hot flashes.  Also c/o dysuria, urgency, and frequency.    No otc treatment tried.  Symptoms present for several days.

## 2014-11-26 NOTE — Discharge Instructions (Signed)
You likely have a viral illness similar to the flu causing the majority of your symptoms. These typically last around 7 days with days 3-5 being the worst. Please use the meloxicam for muscle aches and pains and fevers. Stay well-hydrated and get plenty of rest. Please use the Pyridium for bladder pain. Please use the Zofran for nausea.

## 2014-11-26 NOTE — ED Provider Notes (Signed)
CSN: [id number]     Arrival date & time 11/26/14  1419 History   First MD Initiated Contact with Patient 11/26/14 1528     Chief Complaint  Patient presents with  . Urinary Tract Infection  . Influenza   (Consider location/radiation/quality/duration/timing/severity/associated sxs/prior Treatment) HPI   Body aches 3 days ago. associtate w/ nausea, subjective fevers and chills, and nasal congestion. Alka seltzer w/ some improvement. Smoking makes it worse. Not getting better. Denies cp, sob, palpitations, syncope, vomiting, diarrhea.   1 week ago developed sensation of incomplete bladder emptying. No frequency. Associated w/ back pain. dysuria  Nexplanon in place for birth control  Past Medical History  Diagnosis Date  . No pertinent past medical history   . Depression    Past Surgical History  Procedure Laterality Date  . Wisdom tooth extraction     Family History  Problem Relation Age of Onset  . Hypertension Father    History  Substance Use Topics  . Smoking status: Current Every Day Smoker -- 1.00 packs/day    Types: Cigarettes    Last Attempt to Quit: 04/20/2011  . Smokeless tobacco: Never Used  . Alcohol Use: No   OB History    Gravida Para Term Preterm AB TAB SAB Ectopic Multiple Living   0 1 0 1 0 0 2     Review of Systems Per HPI with all other pertinent systems negative.   Allergies  Review of patient's allergies indicates no known allergies.  Home Medications   Prior to Admission medications   Medication Sig Start Date End Date Taking? Authorizing Provider  buPROPion (WELLBUTRIN XL) 150 MG 24 hr tablet Take 150 mg by mouth daily.   Yes Historical Provider, MD  meloxicam (MOBIC) 15 MG tablet Take 0.5-1 tablets (7.5-15 mg total) by mouth daily. 11/26/14   Ozella Rocks, MD  ondansetron (ZOFRAN) 4 MG tablet Take 1 tablet (4 mg total) by mouth every 8 (eight) hours as needed for nausea or vomiting. 11/26/14   Ozella Rocks, MD  phenazopyridine  (PYRIDIUM) 100 MG tablet Take 1-2 tablets (100-200 mg total) by mouth 3 (three) times daily as needed for pain. 11/26/14   Ozella Rocks, MD   BP 115/65 mmHg  Pulse 64  Temp(Src) 97.8 F (36.6 C) (Oral)  Resp 16  SpO2 98%  LMP 11/08/2014 (Exact Date) Physical Exam Physical Exam  Constitutional: oriented to person, place, and time. appears well-developed and well-nourished. No distress.  HENT:  Head: Normocephalic and atraumatic.  Eyes: EOMI. PERRL.  Neck: Normal range of motion.  Cardiovascular: RRR, no m/r/g, 2+ distal pulses,  Pulmonary/Chest: Effort normal and breath sounds normal. No respiratory distress.  Abdominal: Soft. Bowel sounds are normal. NonTTP, no distension.  Musculoskeletal: Normal range of motion. Non ttp, no effusion.  Neurological: alert and oriented to person, place, and time.  Skin: Skin is warm. No rash noted. non diaphoretic.  Psychiatric: normal mood and affect. behavior is normal. Judgment and thought content normal.   ED Course  Procedures (including critical care time) Labs Review Labs Reviewed  POCT URINALYSIS DIP (DEVICE) - Abnormal; Notable for the following:    Hgb urine dipstick TRACE (*)    All other components within normal limits  POCT PREGNANCY, URINE    Imaging Review No results found.   MDM   1. Interstitial cystitis   2. Flu-like symptoms    Zofran, Pyridium, urine culture sent, meloxicam, fluids, rest.    Ozella Rocks,  MD 11/26/14 1621

## 2014-12-30 ENCOUNTER — Emergency Department (HOSPITAL_COMMUNITY)
Admission: EM | Admit: 2014-12-30 | Discharge: 2014-12-30 | Disposition: A | Discharge status: Home / Self Care | Payer: Medicaid Other | Source: Home / Self Care | Attending: Family Medicine | Admitting: Family Medicine

## 2014-12-30 ENCOUNTER — Other Ambulatory Visit (HOSPITAL_COMMUNITY)
Admission: RE | Admit: 2014-12-30 | Discharge: 2014-12-30 | Disposition: A | Discharge status: Home / Self Care | Payer: Medicaid Other | Source: Ambulatory Visit | Attending: Family Medicine | Admitting: Family Medicine

## 2014-12-30 ENCOUNTER — Encounter (HOSPITAL_COMMUNITY): Payer: Self-pay | Admitting: Emergency Medicine

## 2014-12-30 DIAGNOSIS — Z113 Encounter for screening for infections with a predominantly sexual mode of transmission: Secondary | ICD-10-CM | POA: Insufficient documentation

## 2014-12-30 DIAGNOSIS — N760 Acute vaginitis: Secondary | ICD-10-CM

## 2014-12-30 LAB — POCT URINALYSIS DIP (DEVICE)
Bilirubin Urine: NEGATIVE
Glucose, UA: NEGATIVE mg/dL
Ketones, ur: NEGATIVE mg/dL
Leukocytes, UA: NEGATIVE
Nitrite: NEGATIVE
Protein, ur: NEGATIVE mg/dL
Specific Gravity, Urine: 1.015 (ref 1.005–1.030)
Urobilinogen, UA: 0.2 mg/dL (ref 0.0–1.0)
pH: 7 (ref 5.0–8.0)

## 2014-12-30 LAB — POCT PREGNANCY, URINE: Preg Test, Ur: NEGATIVE

## 2014-12-30 MED ORDER — FLUCONAZOLE 150 MG PO TABS: 150.0000 mg | ORAL_TABLET | Freq: Once | ORAL | Status: DC

## 2014-12-30 MED ORDER — METRONIDAZOLE 500 MG PO TABS
500.0000 mg | ORAL_TABLET | Freq: Two times a day (BID) | ORAL | Status: DC
Start: 2014-12-30 — End: 2014-12-31

## 2014-12-30 NOTE — ED Notes (Signed)
C/o pos UTI and white vag d/c onset 2 weeks  Sx include dysuria, abd pain, back pain and feeling nauseas Denies fevers, chills, v/d Hx of BV Alert, no signs of acute distress.

## 2014-12-30 NOTE — ED Provider Notes (Addendum)
Kimberly Barrera is a 24 y.o. female who presents to Urgent Care today for frequency urgency and dysuria since she was mild pelvic pain. Symptoms present for few days and consistent with previous episodes of UTI. She's tried some AZO and leftover antibiotics that she cannot recall the name of. This has helped a little. Additionally she notes whitish vaginal discharge with a fishy odor. The odor has improved however the discharge is still present. No fevers or chills vomiting or diarrhea.   Past Medical History  Diagnosis Date  . No pertinent past medical history   . Depression    Past Surgical History  Procedure Laterality Date  . Wisdom tooth extraction     History  Substance Use Topics  . Smoking status: Current Every Day Smoker -- 1.00 packs/day    Types: Cigarettes    Last Attempt to Quit: 04/20/2011  . Smokeless tobacco: Never Used  . Alcohol Use: No   ROS as above Medications: No current facility-administered medications for this encounter.   Current Outpatient Prescriptions  Medication Sig Dispense Refill  . buPROPion (WELLBUTRIN XL) 150 MG 24 hr tablet Take 150 mg by mouth daily.    . meloxicam (MOBIC) 15 MG tablet Take 0.5-1 tablets (7.5-15 mg total) by mouth daily. 30 tablet 0  . ondansetron (ZOFRAN) 4 MG tablet Take 1 tablet (4 mg total) by mouth every 8 (eight) hours as needed for nausea or vomiting. 20 tablet 0  . phenazopyridine (PYRIDIUM) 100 MG tablet Take 1-2 tablets (100-200 mg total) by mouth 3 (three) times daily as needed for pain. 30 tablet 0  . [DISCONTINUED] sertraline (ZOLOFT) 25 MG tablet Take 1 tablet (25 mg total) by mouth daily. 30 tablet 1   No Known Allergies   Exam:  BP 93/62 mmHg  Pulse 106  Temp(Src) 98.2 F (36.8 C) (Oral)  Resp 106  SpO2 98%  LMP 11/30/2014 Gen: Well NAD HEENT: EOMI,  MMM Lungs: Normal work of breathing. CTABL Heart: Mild tachycardia no MRG Abd: NABS, Soft. Nondistended, Nontender no CV angle tenderness to  percussion Exts: Brisk capillary refill, warm and well perfused.  GYN: Normal external genitalia. Vaginal canal with thin white discharge normal-appearing cervix nontender.  Results for orders placed or performed during the hospital encounter of 12/30/14 (from the past 24 hour(s))  POCT urinalysis dip (device)     Status: Abnormal   Collection Time: 12/30/14 10:34 AM  Result Value Ref Range   Glucose, UA NEGATIVE NEGATIVE mg/dL   Bilirubin Urine NEGATIVE NEGATIVE   Ketones, ur NEGATIVE NEGATIVE mg/dL   Specific Gravity, Urine 1.015 1.005 - 1.030   Hgb urine dipstick TRACE (A) NEGATIVE   pH 7.0 5.0 - 8.0   Protein, ur NEGATIVE NEGATIVE mg/dL   Urobilinogen, UA 0.2 0.0 - 1.0 mg/dL   Nitrite NEGATIVE NEGATIVE   Leukocytes, UA NEGATIVE NEGATIVE  Pregnancy, urine POC     Status: None   Collection Time: 12/30/14 10:36 AM  Result Value Ref Range   Preg Test, Ur NEGATIVE NEGATIVE   No results found.  Assessment and Plan: 24 y.o. female with vaginitis. Vaginal cytology pending for gonorrhea Chlamydia trichomonas BV and yeast. Treat with Flagyl and fluconazole. Urine culture also pending  Discussed warning signs or symptoms. Please see discharge instructions. Patient expresses understanding.     Rodolph BongEvan S Corey, MD 12/30/14 1107  Rodolph BongEvan S Corey, MD 12/30/14 [phone]

## 2014-12-30 NOTE — Discharge Instructions (Signed)
Thank you for coming in today. Call or go to the emergency room if you get worse, have trouble breathing, have chest pains, or palpitations.    Bacterial Vaginosis Bacterial vaginosis is a vaginal infection that occurs when the normal balance of bacteria in the vagina is disrupted. It results from an overgrowth of certain bacteria. This is the most common vaginal infection in women of childbearing age. Treatment is important to prevent complications, especially in pregnant women, as it can cause a premature delivery. CAUSES  Bacterial vaginosis is caused by an increase in harmful bacteria that are normally present in smaller amounts in the vagina. Several different kinds of bacteria can cause bacterial vaginosis. However, the reason that the condition develops is not fully understood. RISK FACTORS Certain activities or behaviors can put you at an increased risk of developing bacterial vaginosis, including:  Having a new sex partner or multiple sex partners.  Douching.  Using an intrauterine device (IUD) for contraception. Women do not get bacterial vaginosis from toilet seats, bedding, swimming pools, or contact with objects around them. SIGNS AND SYMPTOMS  Some women with bacterial vaginosis have no signs or symptoms. Common symptoms include:  Grey vaginal discharge.  A fishlike odor with discharge, especially after sexual intercourse.  Itching or burning of the vagina and vulva.  Burning or pain with urination. DIAGNOSIS  Your health care provider will take a medical history and examine the vagina for signs of bacterial vaginosis. A sample of vaginal fluid may be taken. Your health care provider will look at this sample under a microscope to check for bacteria and abnormal cells. A vaginal pH test may also be done.  TREATMENT  Bacterial vaginosis may be treated with antibiotic medicines. These may be given in the form of a pill or a vaginal cream. A second round of antibiotics may be  prescribed if the condition comes back after treatment.  HOME CARE INSTRUCTIONS   Only take over-the-counter or prescription medicines as directed by your health care provider.  If antibiotic medicine was prescribed, take it as directed. Make sure you finish it even if you start to feel better.  Do not have sex until treatment is completed.  Tell all sexual partners that you have a vaginal infection. They should see their health care provider and be treated if they have problems, such as a mild rash or itching.  Practice safe sex by using condoms and only having one sex partner. SEEK MEDICAL CARE IF:   Your symptoms are not improving after 3 days of treatment.  You have increased discharge or pain.  You have a fever. MAKE SURE YOU:   Understand these instructions.  Will watch your condition.  Will get help right away if you are not doing well or get worse. FOR MORE INFORMATION  Centers for Disease Control and Prevention, Division of STD Prevention: SolutionApps.co.zawww.cdc.gov/std American Sexual Health Association (ASHA): www.ashastd.org  Document Released: 07/31/2005 Document Revised: 05/21/2013 Document Reviewed: 03/12/2013 [facility] Patient Information 2015 [place]. This information is not intended to replace advice given to you by your health care provider. Make sure you discuss any questions you have with your health care provider.

## 2014-12-31 ENCOUNTER — Encounter (HOSPITAL_COMMUNITY): Payer: Self-pay | Admitting: Emergency Medicine

## 2014-12-31 ENCOUNTER — Emergency Department (HOSPITAL_COMMUNITY)
Admission: EM | Admit: 2014-12-31 | Discharge: 2014-12-31 | Disposition: A | Discharge status: Home / Self Care | Payer: Medicaid Other | Source: Home / Self Care | Attending: Family Medicine | Admitting: Family Medicine

## 2014-12-31 DIAGNOSIS — A084 Viral intestinal infection, unspecified: Secondary | ICD-10-CM

## 2014-12-31 LAB — CERVICOVAGINAL ANCILLARY ONLY
Chlamydia: NEGATIVE
Neisseria Gonorrhea: NEGATIVE
Wet Prep (BD Affirm): NEGATIVE

## 2014-12-31 LAB — URINE CULTURE
Colony Count: NO GROWTH
Culture: NO GROWTH
Special Requests: NORMAL

## 2014-12-31 MED ORDER — ONDANSETRON 4 MG PO TBDP
8.0000 mg | ORAL_TABLET | Freq: Once | ORAL | Status: AC
  Administered 2014-12-31: 8 mg via ORAL

## 2014-12-31 MED ORDER — ONDANSETRON HCL 8 MG PO TABS: 8.0000 mg | ORAL_TABLET | Freq: Three times a day (TID) | ORAL | Status: DC | PRN

## 2014-12-31 MED ORDER — ACETAMINOPHEN 325 MG PO TABS
ORAL_TABLET | ORAL | Status: AC
  Filled 2014-12-31: qty 3

## 2014-12-31 MED ORDER — ACETAMINOPHEN 325 MG PO TABS
975.0000 mg | ORAL_TABLET | Freq: Once | ORAL | Status: AC
  Administered 2014-12-31: 975 mg via ORAL

## 2014-12-31 MED ORDER — ONDANSETRON 4 MG PO TBDP
ORAL_TABLET | ORAL | Status: AC
  Filled 2014-12-31: qty 2

## 2014-12-31 NOTE — ED Provider Notes (Signed)
Kimberly Barrera is a 24 y.o. female who presents to Urgent Care today for vomiting and fever. Patient was seen yesterday for UTI and vaginitis. When she went home she developed a fever and vomiting. She did not take either the fluconazole and metronidazole that she was prescribed. She has body aches. She's tried some TheraFlu yesterday which helps some. No chest pains or palpitations.   Past Medical History  Diagnosis Date  . No pertinent past medical history   . Depression    Past Surgical History  Procedure Laterality Date  . Wisdom tooth extraction     History  Substance Use Topics  . Smoking status: Current Every Day Smoker -- 1.00 packs/day    Types: Cigarettes    Last Attempt to Quit: 04/20/2011  . Smokeless tobacco: Never Used  . Alcohol Use: No   ROS as above Medications: No current facility-administered medications for this encounter.   Current Outpatient Prescriptions  Medication Sig Dispense Refill  . buPROPion (WELLBUTRIN XL) 150 MG 24 hr tablet Take 150 mg by mouth daily.    . meloxicam (MOBIC) 15 MG tablet Take 0.5-1 tablets (7.5-15 mg total) by mouth daily. 30 tablet 0  . ondansetron (ZOFRAN) 8 MG tablet Take 1 tablet (8 mg total) by mouth every 8 (eight) hours as needed for nausea or vomiting. 20 tablet 0  . phenazopyridine (PYRIDIUM) 100 MG tablet Take 1-2 tablets (100-200 mg total) by mouth 3 (three) times daily as needed for pain. 30 tablet 0  . [DISCONTINUED] sertraline (ZOLOFT) 25 MG tablet Take 1 tablet (25 mg total) by mouth daily. 30 tablet 1   No Known Allergies   Exam:  BP 101/64 mmHg  Pulse 54  Temp(Src) 99.2 F (37.3 C) (Oral)  Resp 14  SpO2 92%  LMP 11/30/2014 Gen: Well NAD HEENT: EOMI,  MMM normal posterior pharynx and tympanic membranes Lungs: Normal work of breathing. CTABL Heart: RRR no MRG Abd: NABS, Soft. Nondistended, Nontender Exts: Brisk capillary refill, warm and well perfused.   Patient was given 8 mg of ODT Zofran, and 975  mg of oral Tylenol prior to discharge  No results found for this or any previous visit (from the past 24 hour(s)). No results found.  Assessment and Plan: 24 y.o. female with viral gastroenteritis. Continue Zofran follow-up as needed. Working provided.  Discussed warning signs or symptoms. Please see discharge instructions. Patient expresses understanding.     Rodolph BongEvan S Vonna Brabson, MD 12/31/14 [phone]

## 2014-12-31 NOTE — ED Notes (Signed)
Patient c/o emesis x 4 in a 24 hour period. Fever and chills. Last fever was 102 F. Patient reports she has tried taking Theraflu and resting with no acute relief. Patient is in NAD.

## 2014-12-31 NOTE — Discharge Instructions (Signed)
Thank you for coming in today. Continue tylenol. Use zofran for nausea or vomiting.  If your belly pain worsens, or you have high fever, bad vomiting, blood in your stool or black tarry stool go to the Emergency Room.   Viral Gastroenteritis Viral gastroenteritis is also known as stomach flu. This condition affects the stomach and intestinal tract. It can cause sudden diarrhea and vomiting. The illness typically lasts 3 to 8 days. Most people develop an immune response that eventually gets rid of the virus. While this natural response develops, the virus can make you quite ill. CAUSES  Many different viruses can cause gastroenteritis, such as rotavirus or noroviruses. You can catch one of these viruses by consuming contaminated food or water. You may also catch a virus by sharing utensils or other personal items with an infected person or by touching a contaminated surface. SYMPTOMS  The most common symptoms are diarrhea and vomiting. These problems can cause a severe loss of body fluids (dehydration) and a body salt (electrolyte) imbalance. Other symptoms may include:  Fever.  Headache.  Fatigue.  Abdominal pain. DIAGNOSIS  Your caregiver can usually diagnose viral gastroenteritis based on your symptoms and a physical exam. A stool sample may also be taken to test for the presence of viruses or other infections. TREATMENT  This illness typically goes away on its own. Treatments are aimed at rehydration. The most serious cases of viral gastroenteritis involve vomiting so severely that you are not able to keep fluids down. In these cases, fluids must be given through an intravenous line (IV). HOME CARE INSTRUCTIONS   Drink enough fluids to keep your urine clear or pale yellow. Drink small amounts of fluids frequently and increase the amounts as tolerated.  Ask your caregiver for specific rehydration instructions.  Avoid:  Foods high in sugar.  Alcohol.  Carbonated  drinks.  Tobacco.  Juice.  Caffeine drinks.  Extremely hot or cold fluids.  Fatty, greasy foods.  Too much intake of anything at one time.  Dairy products until 24 to 48 hours after diarrhea stops.  You may consume probiotics. Probiotics are active cultures of beneficial bacteria. They may lessen the amount and number of diarrheal stools in adults. Probiotics can be found in yogurt with active cultures and in supplements.  Wash your hands well to avoid spreading the virus.  Only take over-the-counter or prescription medicines for pain, discomfort, or fever as directed by your caregiver. Do not give aspirin to children. Antidiarrheal medicines are not recommended.  Ask your caregiver if you should continue to take your regular prescribed and over-the-counter medicines.  Keep all follow-up appointments as directed by your caregiver. SEEK IMMEDIATE MEDICAL CARE IF:   You are unable to keep fluids down.  You do not urinate at least once every 6 to 8 hours.  You develop shortness of breath.  You notice blood in your stool or vomit. This may look like coffee grounds.  You have abdominal pain that increases or is concentrated in one small area (localized).  You have persistent vomiting or diarrhea.  You have a fever.  The patient is a child younger than 3 months, and he or she has a fever.  The patient is a child older than 3 months, and he or she has a fever and persistent symptoms.  The patient is a child older than 3 months, and he or she has a fever and symptoms suddenly get worse.  The patient is a baby, and he or she  has no tears when crying. MAKE SURE YOU:   Understand these instructions.  Will watch your condition.  Will get help right away if you are not doing well or get worse. Document Released: 07/31/2005 Document Revised: 10/23/2011 Document Reviewed: 05/17/2011 [facility] Patient Information 2015 [place], [place]. This information is not intended to replace  advice given to you by your health care provider. Make sure you discuss any questions you have with your health care provider.

## 2015-02-09 ENCOUNTER — Emergency Department: Payer: Medicaid Other

## 2015-02-09 ENCOUNTER — Encounter: Payer: Self-pay | Admitting: Radiology

## 2015-02-09 ENCOUNTER — Emergency Department
Admission: EM | Admit: 2015-02-09 | Discharge: 2015-02-09 | Disposition: A | Discharge status: Home / Self Care | Payer: Medicaid Other | Attending: Emergency Medicine | Admitting: Emergency Medicine

## 2015-02-09 DIAGNOSIS — F419 Anxiety disorder, unspecified: Secondary | ICD-10-CM | POA: Insufficient documentation

## 2015-02-09 DIAGNOSIS — Y9389 Activity, other specified: Secondary | ICD-10-CM | POA: Diagnosis not present

## 2015-02-09 DIAGNOSIS — S0083XA Contusion of other part of head, initial encounter: Secondary | ICD-10-CM | POA: Diagnosis not present

## 2015-02-09 DIAGNOSIS — Z791 Long term (current) use of non-steroidal anti-inflammatories (NSAID): Secondary | ICD-10-CM | POA: Diagnosis not present

## 2015-02-09 DIAGNOSIS — S8992XA Unspecified injury of left lower leg, initial encounter: Secondary | ICD-10-CM | POA: Insufficient documentation

## 2015-02-09 DIAGNOSIS — Z79899 Other long term (current) drug therapy: Secondary | ICD-10-CM | POA: Insufficient documentation

## 2015-02-09 DIAGNOSIS — S0990XA Unspecified injury of head, initial encounter: Secondary | ICD-10-CM | POA: Diagnosis present

## 2015-02-09 DIAGNOSIS — Y998 Other external cause status: Secondary | ICD-10-CM | POA: Insufficient documentation

## 2015-02-09 DIAGNOSIS — Z720 Tobacco use: Secondary | ICD-10-CM | POA: Insufficient documentation

## 2015-02-09 DIAGNOSIS — S0003XA Contusion of scalp, initial encounter: Secondary | ICD-10-CM | POA: Insufficient documentation

## 2015-02-09 DIAGNOSIS — S1093XA Contusion of unspecified part of neck, initial encounter: Secondary | ICD-10-CM

## 2015-02-09 DIAGNOSIS — S1083XA Contusion of other specified part of neck, initial encounter: Secondary | ICD-10-CM | POA: Diagnosis not present

## 2015-02-09 DIAGNOSIS — I10 Essential (primary) hypertension: Secondary | ICD-10-CM | POA: Insufficient documentation

## 2015-02-09 DIAGNOSIS — F329 Major depressive disorder, single episode, unspecified: Secondary | ICD-10-CM | POA: Diagnosis not present

## 2015-02-09 DIAGNOSIS — Y92410 Unspecified street and highway as the place of occurrence of the external cause: Secondary | ICD-10-CM | POA: Insufficient documentation

## 2015-02-09 DIAGNOSIS — Z3202 Encounter for pregnancy test, result negative: Secondary | ICD-10-CM | POA: Insufficient documentation

## 2015-02-09 LAB — POCT PREGNANCY, URINE: Preg Test, Ur: NEGATIVE

## 2015-02-09 MED ORDER — TRAMADOL HCL 50 MG PO TABS
ORAL_TABLET | ORAL | Status: AC
  Administered 2015-02-09: 50 mg via ORAL
  Filled 2015-02-09: qty 1

## 2015-02-09 MED ORDER — IBUPROFEN 800 MG PO TABS
800.0000 mg | ORAL_TABLET | Freq: Once | ORAL | Status: AC
  Administered 2015-02-09: 800 mg via ORAL

## 2015-02-09 MED ORDER — METHOCARBAMOL 1000 MG/10ML IJ SOLN: 1000.0000 mg | Freq: Once | INTRAMUSCULAR | Status: DC

## 2015-02-09 MED ORDER — METHOCARBAMOL 500 MG PO TABS
500.0000 mg | ORAL_TABLET | Freq: Once | ORAL | Status: AC
  Administered 2015-02-09: 500 mg via ORAL

## 2015-02-09 MED ORDER — METAXALONE 800 MG PO TABS: 800.0000 mg | ORAL_TABLET | Freq: Three times a day (TID) | ORAL | Status: AC

## 2015-02-09 MED ORDER — IBUPROFEN 800 MG PO TABS: 800.0000 mg | ORAL_TABLET | Freq: Three times a day (TID) | ORAL | Status: DC | PRN

## 2015-02-09 MED ORDER — METHOCARBAMOL 500 MG PO TABS
ORAL_TABLET | ORAL | Status: AC
  Filled 2015-02-09: qty 1

## 2015-02-09 MED ORDER — IBUPROFEN 800 MG PO TABS
ORAL_TABLET | ORAL | Status: AC
  Filled 2015-02-09: qty 1

## 2015-02-09 MED ORDER — TRAMADOL HCL 50 MG PO TABS
50.0000 mg | ORAL_TABLET | Freq: Once | ORAL | Status: AC
  Administered 2015-02-09: 50 mg via ORAL

## 2015-02-09 MED ORDER — TRAMADOL HCL 50 MG PO TABS: 50.0000 mg | ORAL_TABLET | Freq: Four times a day (QID) | ORAL | Status: DC | PRN

## 2015-02-09 NOTE — ED Notes (Signed)
Assessment per PA 

## 2015-02-09 NOTE — ED Notes (Signed)
Patient transported to CT 
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
## 2015-02-25 ENCOUNTER — Telehealth: Payer: Self-pay | Admitting: General Practice

## 2015-02-25 NOTE — Telephone Encounter (Signed)
Ok, but she needs to complete paperwork same as everyone else first.

## 2015-02-25 NOTE — Telephone Encounter (Signed)
Patient says that you agreed to see her kids even though they have medicaid, would like to know does that include her even though she has medicaid too?  She dosent want to keep on going to the health department [phone]

## 2015-03-28 ENCOUNTER — Emergency Department (HOSPITAL_COMMUNITY): Payer: No Typology Code available for payment source

## 2015-03-28 ENCOUNTER — Encounter (HOSPITAL_COMMUNITY): Payer: Self-pay | Admitting: *Deleted

## 2015-03-28 ENCOUNTER — Emergency Department (HOSPITAL_COMMUNITY)
Admission: EM | Admit: 2015-03-28 | Discharge: 2015-03-28 | Disposition: A | Discharge status: Home / Self Care | Payer: No Typology Code available for payment source | Attending: Emergency Medicine | Admitting: Emergency Medicine

## 2015-03-28 DIAGNOSIS — Z791 Long term (current) use of non-steroidal anti-inflammatories (NSAID): Secondary | ICD-10-CM | POA: Diagnosis not present

## 2015-03-28 DIAGNOSIS — Z3202 Encounter for pregnancy test, result negative: Secondary | ICD-10-CM | POA: Insufficient documentation

## 2015-03-28 DIAGNOSIS — Y92410 Unspecified street and highway as the place of occurrence of the external cause: Secondary | ICD-10-CM | POA: Insufficient documentation

## 2015-03-28 DIAGNOSIS — Y998 Other external cause status: Secondary | ICD-10-CM | POA: Diagnosis not present

## 2015-03-28 DIAGNOSIS — S3992XA Unspecified injury of lower back, initial encounter: Secondary | ICD-10-CM | POA: Insufficient documentation

## 2015-03-28 DIAGNOSIS — S134XXA Sprain of ligaments of cervical spine, initial encounter: Secondary | ICD-10-CM | POA: Insufficient documentation

## 2015-03-28 DIAGNOSIS — M545 Low back pain, unspecified: Secondary | ICD-10-CM

## 2015-03-28 DIAGNOSIS — Y9389 Activity, other specified: Secondary | ICD-10-CM | POA: Insufficient documentation

## 2015-03-28 DIAGNOSIS — F329 Major depressive disorder, single episode, unspecified: Secondary | ICD-10-CM | POA: Insufficient documentation

## 2015-03-28 DIAGNOSIS — Z720 Tobacco use: Secondary | ICD-10-CM | POA: Insufficient documentation

## 2015-03-28 DIAGNOSIS — S199XXA Unspecified injury of neck, initial encounter: Secondary | ICD-10-CM | POA: Diagnosis present

## 2015-03-28 DIAGNOSIS — S139XXA Sprain of joints and ligaments of unspecified parts of neck, initial encounter: Secondary | ICD-10-CM

## 2015-03-28 LAB — POC URINE PREG, ED: Preg Test, Ur: NEGATIVE

## 2015-03-28 MED ORDER — CYCLOBENZAPRINE HCL 10 MG PO TABS: 10.0000 mg | ORAL_TABLET | Freq: Two times a day (BID) | ORAL | Status: DC | PRN

## 2015-03-28 MED ORDER — ACETAMINOPHEN 500 MG PO TABS
1000.0000 mg | ORAL_TABLET | Freq: Once | ORAL | Status: AC
  Administered 2015-03-28: 1000 mg via ORAL
  Filled 2015-03-28: qty 2
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
## 2015-03-28 NOTE — ED Notes (Signed)
Went over discharge paperwork with pt several times, pt was talking with family mbr during discharge, pt denies any questions,

## 2015-03-28 NOTE — Discharge Instructions (Signed)
If you were given medicines take as directed.  If you are on coumadin or contraceptives realize their levels and effectiveness is altered by many different medicines.  If you have any reaction (rash, tongues swelling, other) to the medicines stop taking and see a physician.    If your blood pressure was elevated in the ER make sure you follow up for management with a primary doctor or return for chest pain, shortness of breath or stroke symptoms.  Please follow up as directed and return to the ER or see a physician for new or worsening symptoms.  Thank you. Filed Vitals:   03/28/15 1903 03/28/15 1906 03/28/15 1930 03/28/15 1945  BP:  125/84 124/69   Pulse:  74 69 68  Temp:  98.7 F (37.1 C)    TempSrc:  Oral    Resp:  18    Height:  (1.651 m)     Weight: 135 lb (61.236 kg)     SpO2:  98% 99% 98%

## 2015-04-02 ENCOUNTER — Encounter: Payer: Self-pay | Admitting: Family Medicine

## 2015-04-02 ENCOUNTER — Ambulatory Visit (INDEPENDENT_AMBULATORY_CARE_PROVIDER_SITE_OTHER): Payer: Self-pay | Admitting: Family Medicine

## 2015-04-02 VITALS — BP 104/62 | HR 88 | Temp 98.1°F | Resp 16 | Ht 63.0 in | Wt 131.0 lb

## 2015-04-02 DIAGNOSIS — Z3049 Encounter for surveillance of other contraceptives: Secondary | ICD-10-CM

## 2015-04-02 DIAGNOSIS — G4700 Insomnia, unspecified: Secondary | ICD-10-CM

## 2015-04-02 DIAGNOSIS — F909 Attention-deficit hyperactivity disorder, unspecified type: Secondary | ICD-10-CM

## 2015-04-02 DIAGNOSIS — F5104 Psychophysiologic insomnia: Secondary | ICD-10-CM

## 2015-04-02 DIAGNOSIS — Z0000 Encounter for general adult medical examination without abnormal findings: Secondary | ICD-10-CM

## 2015-04-02 DIAGNOSIS — F319 Bipolar disorder, unspecified: Secondary | ICD-10-CM

## 2015-04-02 DIAGNOSIS — Z87898 Personal history of other specified conditions: Secondary | ICD-10-CM

## 2015-04-02 DIAGNOSIS — F1911 Other psychoactive substance abuse, in remission: Secondary | ICD-10-CM

## 2015-04-02 DIAGNOSIS — G8929 Other chronic pain: Secondary | ICD-10-CM

## 2015-04-02 DIAGNOSIS — F988 Other specified behavioral and emotional disorders with onset usually occurring in childhood and adolescence: Secondary | ICD-10-CM

## 2015-04-02 DIAGNOSIS — Z8249 Family history of ischemic heart disease and other diseases of the circulatory system: Secondary | ICD-10-CM

## 2015-04-02 DIAGNOSIS — M549 Dorsalgia, unspecified: Secondary | ICD-10-CM

## 2015-04-02 LAB — COMPREHENSIVE METABOLIC PANEL WITH GFR
ALT: 16 U/L (ref 6–29)
AST: 14 U/L (ref 10–30)
Albumin: 4.1 g/dL (ref 3.6–5.1)
Alkaline Phosphatase: 80 U/L (ref 33–115)
BUN: 9 mg/dL (ref 7–25)
CO2: 26 mmol/L (ref 20–31)
Calcium: 9.1 mg/dL (ref 8.6–10.2)
Chloride: 105 mmol/L (ref 98–110)
Creat: 0.81 mg/dL (ref 0.50–1.10)
Glucose, Bld: 75 mg/dL (ref 70–99)
Potassium: 4.1 mmol/L (ref 3.5–5.3)
Sodium: 138 mmol/L (ref 135–146)
Total Bilirubin: 0.6 mg/dL (ref 0.2–1.2)
Total Protein: 6.9 g/dL (ref 6.1–8.1)

## 2015-04-02 LAB — CBC WITH DIFFERENTIAL/PLATELET
Basophils Absolute: 0 10*3/uL (ref 0.0–0.1)
Basophils Relative: 0 % (ref 0–1)
Eosinophils Absolute: 0.1 10*3/uL (ref 0.0–0.7)
Eosinophils Relative: 1 % (ref 0–5)
HCT: 41.6 % (ref 36.0–46.0)
Hemoglobin: 14.3 g/dL (ref 12.0–15.0)
Lymphocytes Relative: 28 % (ref 12–46)
Lymphs Abs: 2.2 10*3/uL (ref 0.7–4.0)
MCH: 32.3 pg (ref 26.0–34.0)
MCHC: 34.4 g/dL (ref 30.0–36.0)
MCV: 93.9 fL (ref 78.0–100.0)
MPV: 9.7 fL (ref 8.6–12.4)
Monocytes Absolute: 0.7 10*3/uL (ref 0.1–1.0)
Monocytes Relative: 9 % (ref 3–12)
Neutro Abs: 4.9 10*3/uL (ref 1.7–7.7)
Neutrophils Relative %: 62 % (ref 43–77)
Platelets: 261 10*3/uL (ref 150–400)
RBC: 4.43 MIL/uL (ref 3.87–5.11)
RDW: 12.9 % (ref 11.5–15.5)
WBC: 7.9 10*3/uL (ref 4.0–10.5)

## 2015-04-02 LAB — LIPID PANEL
Cholesterol: 90 mg/dL — ABNORMAL LOW (ref 125–200)
HDL: 33 mg/dL — ABNORMAL LOW (ref >=46)
LDL Cholesterol: 44 mg/dL (ref <130)
Total CHOL/HDL Ratio: 2.7 Ratio (ref <=5.0)
Triglycerides: 67 mg/dL (ref <150)
VLDL: 13 mg/dL (ref <30)

## 2015-04-02 LAB — TSH: TSH: 1.302 u[IU]/mL (ref 0.350–4.500)

## 2015-04-02 MED ORDER — CLONAZEPAM 0.5 MG PO TABS: 0.5000 mg | ORAL_TABLET | Freq: Two times a day (BID) | ORAL | Status: DC | PRN

## 2015-04-02 MED ORDER — LISDEXAMFETAMINE DIMESYLATE 30 MG PO CAPS: 30.0000 mg | ORAL_CAPSULE | Freq: Every day | ORAL | Status: DC

## 2015-04-02 MED ORDER — BUPROPION HCL ER (XL) 150 MG PO TB24: 150.0000 mg | ORAL_TABLET | Freq: Every day | ORAL | Status: DC

## 2015-04-02 NOTE — Patient Instructions (Addendum)
Release of records- [facility] Referral to eye doctor Start wellbutrin once a day  Restart klonopin Wait 2 weeks then start the vyvanse  Referral to psychiatry  We will call with lab results  F/U 4 weeks
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
## 2015-04-04 ENCOUNTER — Encounter: Payer: Self-pay | Admitting: Family Medicine

## 2015-04-04 DIAGNOSIS — M549 Dorsalgia, unspecified: Secondary | ICD-10-CM | POA: Insufficient documentation

## 2015-04-04 DIAGNOSIS — Z309 Encounter for contraceptive management, unspecified: Secondary | ICD-10-CM | POA: Insufficient documentation

## 2015-04-04 DIAGNOSIS — G8929 Other chronic pain: Secondary | ICD-10-CM | POA: Insufficient documentation

## 2015-04-04 NOTE — Assessment & Plan Note (Signed)
Plan to add back vyvanse at lower dose in 2 weeks once she is back on her wellbutrin

## 2015-04-04 NOTE — Assessment & Plan Note (Signed)
Referral to GYN for removal of nexplanon and for PAP exam and further contraception as she is considering replacing inplant

## 2015-04-04 NOTE — Assessment & Plan Note (Signed)
I agree with no addictive medications, reviewed xrays nothing abnormal seen

## 2015-04-04 NOTE — Assessment & Plan Note (Signed)
Will refer to psychiatry she has extensive psychological background and high risk for relapse with her recent substance abuse with Heroin. She is under a lot of stress and support network is weak. She needs therapy along with medications and she agrees to this. Hopefully she will be able to progress to getting her GED and a better job opportunity.  Will restart her wellbutrin and klonopin, will have quick follow-up to see how she is doing on medications Consider adding seroquel for sleep

## 2015-04-07 NOTE — Progress Notes (Signed)
[facility], faxed request to [employer] for processing 04/06/15, confirmation rec'd.

## 2015-04-26 ENCOUNTER — Ambulatory Visit (INDEPENDENT_AMBULATORY_CARE_PROVIDER_SITE_OTHER): Payer: Medicaid Other | Admitting: Women's Health

## 2015-04-26 ENCOUNTER — Encounter: Payer: Self-pay | Admitting: Women's Health

## 2015-04-26 VITALS — BP 110/60 | HR 68 | Ht 65.0 in | Wt 133.0 lb

## 2015-04-26 DIAGNOSIS — Z3046 Encounter for surveillance of implantable subdermal contraceptive: Secondary | ICD-10-CM

## 2015-04-26 DIAGNOSIS — Z3049 Encounter for surveillance of other contraceptives: Secondary | ICD-10-CM | POA: Diagnosis not present

## 2015-04-26 DIAGNOSIS — Z3202 Encounter for pregnancy test, result negative: Secondary | ICD-10-CM | POA: Diagnosis not present

## 2015-04-26 DIAGNOSIS — Z30011 Encounter for initial prescription of contraceptive pills: Secondary | ICD-10-CM

## 2015-04-26 DIAGNOSIS — Z8669 Personal history of other diseases of the nervous system and sense organs: Secondary | ICD-10-CM

## 2015-04-26 LAB — POCT URINE PREGNANCY: Preg Test, Ur: NEGATIVE

## 2015-04-26 MED ORDER — NORETHINDRONE 0.35 MG PO TABS: ORAL_TABLET | ORAL | Status: DC

## 2015-04-26 NOTE — Progress Notes (Signed)
Patient ID: Kimberly Barrera, female   DOB: 04-20-1991, 24 y.o.   MRN: [id number] Kimberly Barrera is a 24 y.o. year old Caucasian female here for Nexplanon removal.  Patient given informed consent for removal of her Nexplanon. Was placed 02/2012. Doesn't want another nexplanon, feels like it has made her very moody, has had to get on wellbutrin and klonopin. Does report migraines w/ aura- sees spots, so she is not a candidate for contraception w/ estrogen. Discussed progestin only methods, wants POPs. Discussed at length the importance of taking at exact same time daily and if even more than 1hr off can potentially become pregnant- states she will take every morning when she takes wellbutrin. Also needs pap.   BP 110/60 mmHg  Pulse 68  Ht  (1.651 m)  Wt 133 lb (60.328 kg)  BMI 22.13 kg/m2  LMP 01/26/2015  Appropriate time out taken. Nexplanon site identified.  Area prepped in usual sterile fashon. One cc of 2% lidocaine was used to anesthetize the area at the distal end of the implant. A small stab incision was made right beside the implant on the distal portion.  The Nexplanon rod was grasped using hemostats and removed without difficulty.  There was less than 3 cc blood loss. There were no complications.  Steri-strips were applied over the small incision and a pressure bandage was applied.  The patient tolerated the procedure well.  She was instructed to keep the area clean and dry, remove pressure bandage in 24 hours, and keep insertion site covered with the steri-strip for 3-5 days.    Rx micronor w/ 11RF, take at exact same time daily-set alarm to help remind her, use condoms for std prevention F/U 2wks for pap & physical  Marge Duncans CNM, [facility] 04/26/2015 4:42 PM

## 2015-04-26 NOTE — Patient Instructions (Signed)
Take birth control pills at exact same time daily, set an alarm on your phone to help remind you. If you are even more than 1 hour off you can get pregnant  Norethindrone tablets (contraception) What is this medicine? NORETHINDRONE (nor eth IN drone) is an oral contraceptive. The product contains a female hormone known as a progestin. It is used to prevent pregnancy. This medicine may be used for other purposes; ask your health care provider or pharmacist if you have questions. COMMON BRAND NAME(S): Camila, Deblitane 28-Day, Errin, Heather, Patterson, Jolivette, Del Sol, Nor-QD, Nora-BE, Norlyroc, Ortho Micronor, [employer] 28-Day What should I tell my health care provider before I take this medicine? They need to know if you have any of these conditions: -blood vessel disease or blood clots -breast, cervical, or vaginal cancer -diabetes -heart disease -kidney disease -liver disease -mental depression -migraine -seizures -stroke -vaginal bleeding -an unusual or allergic reaction to norethindrone, other medicines, foods, dyes, or preservatives -pregnant or trying to get pregnant -breast-feeding How should I use this medicine? Take this medicine by mouth with a glass of water. You may take it with or without food. Follow the directions on the prescription label. Take this medicine at the same time each day and in the order directed on the package. Do not take your medicine more often than directed. Contact your pediatrician regarding the use of this medicine in children. Special care may be needed. This medicine has been used in female children who have started having menstrual periods. A patient package insert for the product will be given with each prescription and refill. Read this sheet carefully each time. The sheet may change frequently. Overdosage: If you think you have taken too much of this medicine contact a poison control center or emergency room at once. NOTE: This medicine is only  for you. Do not share this medicine with others. What if I miss a dose? Try not to miss a dose. Every time you miss a dose or take a dose late your chance of pregnancy increases. When 1 pill is missed (even if only 3 hours late), take the missed pill as soon as possible and continue taking a pill each day at the regular time (use a back up method of birth control for the next 48 hours). If more than 1 dose is missed, use an additional birth control method for the rest of your pill pack until menses occurs. Contact your health care professional if more than 1 dose has been missed. What may interact with this medicine? Do not take this medicine with any of the following medications: -amprenavir or fosamprenavir -bosentan This medicine may also interact with the following medications: -antibiotics or medicines for infections, especially rifampin, rifabutin, rifapentine, and griseofulvin, and possibly penicillins or tetracyclines -aprepitant -barbiturate medicines, such as phenobarbital -carbamazepine -felbamate -modafinil -oxcarbazepine -phenytoin -ritonavir or other medicines for HIV infection or AIDS -St. John's wort -topiramate This list may not describe all possible interactions. Give your health care provider a list of all the medicines, herbs, non-prescription drugs, or dietary supplements you use. Also tell them if you smoke, drink alcohol, or use illegal drugs. Some items may interact with your medicine. What should I watch for while using this medicine? Visit your doctor or health care professional for regular checks on your progress. You will need a regular breast and pelvic exam and Pap smear while on this medicine. Use an additional method of birth control during the first cycle that you take these tablets. If you  have any reason to think you are pregnant, stop taking this medicine right away and contact your doctor or health care professional. If you are taking this medicine for  hormone related problems, it may take several cycles of use to see improvement in your condition. This medicine does not protect you against HIV infection (AIDS) or any other sexually transmitted diseases. What side effects may I notice from receiving this medicine? Side effects that you should report to your doctor or health care professional as soon as possible: -breast tenderness or discharge -pain in the abdomen, chest, groin or leg -severe headache -skin rash, itching, or hives -sudden shortness of breath -unusually weak or tired -vision or speech problems -yellowing of skin or eyes Side effects that usually do not require medical attention (report to your doctor or health care professional if they continue or are bothersome): -changes in sexual desire -change in menstrual flow -facial hair growth -fluid retention and swelling -headache -irritability -nausea -weight gain or loss This list may not describe all possible side effects. Call your doctor for medical advice about side effects. You may report side effects to FDA at 1-800-FDA-1088. Where should I keep my medicine? Keep out of the reach of children. Store at room temperature between 15 and 30 degrees C (59 and 86 degrees F). Throw away any unused medicine after the expiration date. NOTE: This sheet is a summary. It may not cover all possible information. If you have questions about this medicine, talk to your doctor, pharmacist, or health care provider.  2015, Elsevier/Gold Standard. (2012-04-19 16:41:35)

## 2015-04-30 ENCOUNTER — Ambulatory Visit: Payer: Self-pay | Admitting: Family Medicine

## 2015-05-04 ENCOUNTER — Telehealth (HOSPITAL_COMMUNITY): Payer: Self-pay | Admitting: *Deleted

## 2015-05-04 NOTE — Telephone Encounter (Signed)
Called pt at 9:36 to schedule a new pt appt due to ref. Doctor call. Left message on pt number provided in system and number was provided for pt to call office back.

## 2015-05-05 ENCOUNTER — Telehealth: Payer: Self-pay | Admitting: General Practice

## 2015-05-05 NOTE — Telephone Encounter (Signed)
Called and spoke to pt and informed her that there are refills on both medications and she needed to contact the pharmacy. She verbalized understanding.

## 2015-05-05 NOTE — Telephone Encounter (Signed)
[facility]  Klonopin and wellbutrin  Needs refill on both of these if possible  She missed appointment the other day and is going to reschedule but dr Jeanice Lim was not here this week and did not know what day she could come in next week  [phone] if any questions

## 2015-05-10 ENCOUNTER — Other Ambulatory Visit: Payer: Medicaid Other | Admitting: Women's Health

## 2015-05-12 ENCOUNTER — Telehealth: Payer: Self-pay | Admitting: Family Medicine

## 2015-05-12 MED ORDER — LISDEXAMFETAMINE DIMESYLATE 60 MG PO CAPS: 60.0000 mg | ORAL_CAPSULE | ORAL | Status: DC

## 2015-05-12 MED ORDER — BUPROPION HCL ER (XL) 300 MG PO TB24: 300.0000 mg | ORAL_TABLET | Freq: Every day | ORAL | Status: DC

## 2015-05-12 NOTE — Telephone Encounter (Signed)
Pt here in office with daughter, Wanted Vyvanse increased, and wellbutrin , n [id number]$300mg past week, will give new rx for this, she was on higher doses of vyvanse in the past

## 2015-05-18 ENCOUNTER — Other Ambulatory Visit: Payer: Medicaid Other | Admitting: Women's Health

## 2015-05-24 ENCOUNTER — Ambulatory Visit (INDEPENDENT_AMBULATORY_CARE_PROVIDER_SITE_OTHER): Payer: Medicaid Other | Admitting: Family Medicine

## 2015-05-24 ENCOUNTER — Ambulatory Visit: Payer: Medicaid Other | Admitting: Family Medicine

## 2015-05-24 ENCOUNTER — Encounter: Payer: Self-pay | Admitting: Family Medicine

## 2015-05-24 VITALS — BP 118/70 | HR 78 | Temp 98.3°F | Resp 16 | Ht 63.0 in | Wt 131.0 lb

## 2015-05-24 DIAGNOSIS — F319 Bipolar disorder, unspecified: Secondary | ICD-10-CM | POA: Diagnosis not present

## 2015-05-24 DIAGNOSIS — F1910 Other psychoactive substance abuse, uncomplicated: Secondary | ICD-10-CM | POA: Diagnosis not present

## 2015-05-24 NOTE — Assessment & Plan Note (Addendum)
She has mental illness along with her substance abuse. I've given her the number for the psychiatrist again. In general she feels like her depression has improved with the well future and she is also on Vyvanse and  Low-dose benzo diazepam. I'm concerned about stopping the benzo diazepam as I'm not quite sure everything that she has mixed together and I do not want anticipate withdrawals even further. Hopefully we can get her in with the Suboxone clinic tomorrow there waking consider weaning her off of the benzos along with the other pain medication and street drug use. She is going to need a psychiatrist as well as a therapist which she needs to take the initiative to set up   Approx  40 minutes spent with pt > 50% on couseling, finding resources/appt

## 2015-05-24 NOTE — Assessment & Plan Note (Signed)
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 I discussed with her the least tapering off of the medications but she is concerned that she will get very sick. We discussed going into a detox program but she declines going inpatient. I called around myself for Suboxone and detox programs I've given her resources for 2 that she can call or walk into tomorrow and she agrees to do this. We will follow-up by phone to ensure that she didn't follow through. We discussed the ramifications of this on her family as well as the custody of her children she seems genuinely wanting to get herself clean
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
## 2015-05-24 NOTE — Patient Instructions (Signed)
Please go to either of the locations like we discussed Call psychiatry back as well- [phone] F/U 4 weeks

## 2015-05-26 ENCOUNTER — Ambulatory Visit (INDEPENDENT_AMBULATORY_CARE_PROVIDER_SITE_OTHER): Payer: Medicaid Other | Admitting: Family Medicine

## 2015-05-26 ENCOUNTER — Encounter: Payer: Self-pay | Admitting: Family Medicine

## 2015-05-26 ENCOUNTER — Telehealth: Payer: Self-pay | Admitting: Family Medicine

## 2015-05-26 VITALS — BP 118/64 | HR 78 | Temp 98.4°F | Resp 16 | Ht 63.0 in | Wt 131.0 lb

## 2015-05-26 DIAGNOSIS — J069 Acute upper respiratory infection, unspecified: Secondary | ICD-10-CM | POA: Diagnosis not present

## 2015-05-26 LAB — RAPID STREP SCREEN (MED CTR MEBANE ONLY): Streptococcus, Group A Screen (Direct): NEGATIVE

## 2015-05-26 MED ORDER — BUPROPION HCL ER (XL) 300 MG PO TB24: 300.0000 mg | ORAL_TABLET | Freq: Every day | ORAL | Status: DC

## 2015-05-26 MED ORDER — CLINDAMYCIN PHOS-BENZOYL PEROX 1-5 % EX GEL: Freq: Two times a day (BID) | CUTANEOUS | Status: DC

## 2015-05-26 MED ORDER — AZITHROMYCIN 250 MG PO TABS: ORAL_TABLET | ORAL | Status: DC

## 2015-05-26 NOTE — Progress Notes (Signed)
Patient ID: Kimberly MiresBrittany G Broshears, female   DOB: 04/23/1991, 24 y.o.   MRN: [id number]   Subjective:    Patient ID: Kimberly Barrera, female    DOB: 05/31/1991, 24 y.o.   MRN: [id number]  Patient presents for Illness  patient here with sore throat cough congestion she states she is just not feeling well. She's also had nasal drainage and sinus pressure. This started about a week ago she thought it was getting better but it began to worsen the past 2 days. I did see her 48 hours ago when we discussed her substance abuse. She did follow-up with the Suboxone clinic and had her intake. They also did a rapid HIV test and some other testing.    Review Of Systems:  GEN-+ fatigue, fever, weight loss,weakness, recent illness HEENT- denies eye drainage, change in vision,+ nasal discharge, CVS- denies chest pain, palpitations RESP- denies SOB,+ cough, wheeze ABD- denies N/V, change in stools, abd pain GU- denies dysuria, hematuria, dribbling, incontinence MSK- denies joint pain, muscle aches, injury Neuro- denies headache, dizziness, syncope, seizure activity       Objective:    BP 118/64 mmHg  Pulse 78  Temp(Src) 98.4 F (36.9 C) (Oral)  Resp 16  Ht 5\' 3"  (1.6 m)  Wt 131 lb (59.421 kg)  BMI 23.21 kg/m2 GEN- NAD, alert and oriented x3 HEENT- PERRL, EOMI, non injected sclera, pink conjunctiva, MMM, oropharynx mild injection, enlarged tonsils  TM clear bilat no effusion,  + mild  maxillary sinus tenderness, inflammed turbinates,  + drainage  Neck- Supple, + anterior LAD CVS- RRR, no murmur RESP-CTAB EXT- No edema Pulses- Radial 2+         Assessment & Plan:      Problem List Items Addressed This Visit    None    Visit Diagnoses    Acute URI    -  Primary    Strep neg, with Drug use, other exposures and symptoms of sinusitis will add zpak, mucinex, nasal saline rinse    Relevant Medications    azithromycin (ZITHROMAX) 250 MG tablet    Other Relevant Orders    Rapid Strep  Screen (Completed)       Note: This dictation was prepared with Dragon dictation along with smaller phrase technology. Any transcriptional errors that result from this process are unintentional.

## 2015-05-26 NOTE — Telephone Encounter (Signed)
This has been sent again 

## 2015-05-26 NOTE — Patient Instructions (Signed)
Take antibiotics as prescribed Use mucinex DM over the counter Continue claritin Nasal saline

## 2015-05-26 NOTE — Addendum Note (Signed)
Addended by: Milinda AntisURHAM, Catcher Dehoyos F on: 05/26/2015 03:29 PM   Modules accepted: Orders

## 2015-05-26 NOTE — Telephone Encounter (Signed)
Pharmacy dose not have increased Rx for Wellbutrin or cream for face.  Please resend.

## 2015-05-27 ENCOUNTER — Other Ambulatory Visit: Payer: Medicaid Other | Admitting: Obstetrics & Gynecology

## 2015-05-31 ENCOUNTER — Other Ambulatory Visit: Payer: Medicaid Other | Admitting: Obstetrics & Gynecology

## 2015-05-31 ENCOUNTER — Encounter: Payer: Self-pay | Admitting: *Deleted

## 2015-06-07 ENCOUNTER — Encounter: Payer: Self-pay | Admitting: Family Medicine

## 2015-06-07 ENCOUNTER — Ambulatory Visit (INDEPENDENT_AMBULATORY_CARE_PROVIDER_SITE_OTHER): Payer: Medicaid Other | Admitting: Family Medicine

## 2015-06-07 VITALS — BP 128/72 | HR 76 | Temp 98.4°F | Resp 14 | Ht 63.0 in | Wt 132.0 lb

## 2015-06-07 DIAGNOSIS — N898 Other specified noninflammatory disorders of vagina: Secondary | ICD-10-CM | POA: Diagnosis not present

## 2015-06-07 DIAGNOSIS — Z3200 Encounter for pregnancy test, result unknown: Secondary | ICD-10-CM | POA: Diagnosis not present

## 2015-06-07 DIAGNOSIS — R109 Unspecified abdominal pain: Secondary | ICD-10-CM | POA: Diagnosis not present

## 2015-06-07 LAB — URINALYSIS, MICROSCOPIC ONLY
Casts: NONE SEEN [LPF]
Crystals: NONE SEEN [HPF]
Yeast: NONE SEEN [HPF]

## 2015-06-07 LAB — URINALYSIS, ROUTINE W REFLEX MICROSCOPIC
Bilirubin Urine: NEGATIVE
Glucose, UA: NEGATIVE
Ketones, ur: NEGATIVE
Leukocytes, UA: NEGATIVE
Nitrite: NEGATIVE
Protein, ur: NEGATIVE
Specific Gravity, Urine: 1.02 (ref 1.001–1.035)
pH: 7.5 (ref 5.0–8.0)

## 2015-06-07 LAB — WET PREP FOR TRICH, YEAST, CLUE
Clue Cells Wet Prep HPF POC: NONE SEEN
Trich, Wet Prep: NONE SEEN
Yeast Wet Prep HPF POC: NONE SEEN

## 2015-06-07 LAB — PREGNANCY, URINE: Preg Test, Ur: NEGATIVE

## 2015-06-07 MED ORDER — ONDANSETRON HCL 4 MG PO TABS: 4.0000 mg | ORAL_TABLET | Freq: Three times a day (TID) | ORAL | Status: DC | PRN

## 2015-06-07 MED ORDER — ESOMEPRAZOLE MAGNESIUM 20 MG PO CPDR: 20.0000 mg | DELAYED_RELEASE_CAPSULE | Freq: Every day | ORAL | Status: DC

## 2015-06-07 NOTE — Patient Instructions (Signed)
Your urine sample is negative Your wet prep is negative We will call with other labs Take the nausea medication as needed Try the nexium F/U as needed

## 2015-06-07 NOTE — Progress Notes (Signed)
Patient ID: Kimberly Barrera, female   DOB: 08/15/1990, 24 y.o.   MRN: [id number]   Subjective:    Patient ID: Kimberly MiresBrittany G Barrera, female    DOB: 07/21/1991, 24 y.o.   MRN: [id number]  Patient presents for Pregnancy Test  patient here with nausea and upset stomach mostly in the morning for the past week. Note this is also around the same time she started Suboxone. She has history of gastritis and ulcers in the stomach as well and her symptoms were similar. She stopped taking her oral contraceptive and has had some spotting the past 2 days but not a full fledged period. She denies any dysuria but has had some mild vaginal discharge. Previous implant so LMP unknown    Review Of Systems:  GEN- denies fatigue, fever, weight loss,weakness, recent illness HEENT- denies eye drainage, change in vision, nasal discharge, CVS- denies chest pain, palpitations RESP- denies SOB, cough, wheeze ABD- denies N/V, change in stools, abd pain GU- denies dysuria, hematuria, dribbling, incontinence MSK- denies joint pain, muscle aches, injury Neuro- denies headache, dizziness, syncope, seizure activity       Objective:    BP 128/72 mmHg  Pulse 76  Temp(Src) 98.4 F (36.9 C) (Oral)  Resp 14  Ht 5\' 3"  (1.6 m)  Wt 132 lb (59.875 kg)  BMI 23.39 kg/m2 GEN- NAD, alert and oriented x3 HEENT- PERRL, EOMI, non injected sclera, pink conjunctiva, MMM, oropharynx clear CVS- RRR, no murmur RESP-CTAB ABD-NABS,soft,NT,ND, no CVA tenderness GU- normal external genitalia, vaginal mucosa pink and moist, cervix visualized no growth, no blood form os,+discharge, no CMT, no ovarian masses, uterus normal size EXT- No edema Pulses- Radial 2+        Assessment & Plan:      Problem List Items Addressed This Visit    None    Visit Diagnoses    Pregnancy examination or test, pregnancy unconfirmed    -  Primary    Upreg neg, expect period to start as she now has spotting    Relevant Orders    Pregnancy, urine  (Completed)    Vaginal discharge        Wet prep and UA neg, Gc CULTURE PENDING    Relevant Orders    WET PREP FOR TRICH, YEAST, CLUE (Completed)    GC/Chlamydia Probe Amp    Abdominal pain, unspecified abdominal location        possible gastritis, N/V could be from the suboxone as well, given zofran and nexium to try     Relevant Orders    Urinalysis, Routine w reflex microscopic (not [facility]) (Completed)       Note: This dictation was prepared with Dragon dictation along with smaller phrase technology. Any transcriptional errors that result from this process are unintentional.

## 2015-06-08 ENCOUNTER — Encounter: Payer: Self-pay | Admitting: *Deleted

## 2015-06-08 LAB — GC/CHLAMYDIA PROBE AMP
CT Probe RNA: NEGATIVE
GC Probe RNA: NEGATIVE

## 2015-06-08 NOTE — Telephone Encounter (Signed)
This encounter was created in error - please disregard.

## 2015-06-21 ENCOUNTER — Telehealth: Payer: Self-pay | Admitting: General Practice

## 2015-06-21 ENCOUNTER — Ambulatory Visit: Payer: Medicaid Other | Admitting: Family Medicine

## 2015-06-21 NOTE — Telephone Encounter (Signed)
Pt called to see if she can bring by her paperwork from her ER visit from 06/19/15 and have Dr. Jeanice Limurham write for her medication that she got from the hospital. She states that it will cost less to have it filled from here, because the hospital does not have her insurance. Please advise. [phone]

## 2015-06-21 NOTE — Telephone Encounter (Signed)
Patient will need OV from ER F/U.

## 2015-06-25 ENCOUNTER — Encounter: Payer: Self-pay | Admitting: Family Medicine

## 2015-06-25 ENCOUNTER — Ambulatory Visit (INDEPENDENT_AMBULATORY_CARE_PROVIDER_SITE_OTHER): Payer: Medicaid Other | Admitting: Family Medicine

## 2015-06-25 VITALS — BP 138/72 | HR 80 | Temp 98.4°F | Resp 16 | Ht 63.0 in | Wt 133.0 lb

## 2015-06-25 DIAGNOSIS — F909 Attention-deficit hyperactivity disorder, unspecified type: Secondary | ICD-10-CM | POA: Diagnosis not present

## 2015-06-25 DIAGNOSIS — S42001A Fracture of unspecified part of right clavicle, initial encounter for closed fracture: Secondary | ICD-10-CM | POA: Diagnosis not present

## 2015-06-25 DIAGNOSIS — F419 Anxiety disorder, unspecified: Secondary | ICD-10-CM | POA: Diagnosis not present

## 2015-06-25 DIAGNOSIS — F319 Bipolar disorder, unspecified: Secondary | ICD-10-CM

## 2015-06-25 DIAGNOSIS — H547 Unspecified visual loss: Secondary | ICD-10-CM

## 2015-06-25 DIAGNOSIS — F988 Other specified behavioral and emotional disorders with onset usually occurring in childhood and adolescence: Secondary | ICD-10-CM

## 2015-06-25 MED ORDER — CLONAZEPAM 0.5 MG PO TABS: 0.5000 mg | ORAL_TABLET | Freq: Two times a day (BID) | ORAL | Status: DC | PRN

## 2015-06-25 MED ORDER — BUPROPION HCL ER (XL) 150 MG PO TB24: ORAL_TABLET | ORAL | Status: DC

## 2015-06-25 MED ORDER — LISDEXAMFETAMINE DIMESYLATE 60 MG PO CAPS: 60.0000 mg | ORAL_CAPSULE | ORAL | Status: DC

## 2015-06-25 MED ORDER — NAPROXEN 500 MG PO TABS: 500.0000 mg | ORAL_TABLET | Freq: Two times a day (BID) | ORAL | Status: DC
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
## 2015-06-25 NOTE — Patient Instructions (Addendum)
[facility] - [facility]RGENTLY Orthopedics referral Restart wellbutrin 150mg  once a day for 2 weeks, then increase to 2 tablets  Eye doctor referral  F/U 4 weeks
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
## 2015-06-25 NOTE — Assessment & Plan Note (Signed)
Continue vyvanse- refilled today

## 2015-07-26 ENCOUNTER — Ambulatory Visit: Payer: Self-pay | Admitting: Family Medicine

## 2015-08-25 ENCOUNTER — Encounter: Payer: Self-pay | Admitting: Family Medicine

## 2015-08-25 ENCOUNTER — Ambulatory Visit (INDEPENDENT_AMBULATORY_CARE_PROVIDER_SITE_OTHER): Payer: Medicaid Other | Admitting: Family Medicine

## 2015-08-25 VITALS — BP 124/64 | HR 78 | Temp 98.1°F | Resp 16 | Ht 63.0 in | Wt 130.0 lb

## 2015-08-25 DIAGNOSIS — F319 Bipolar disorder, unspecified: Secondary | ICD-10-CM | POA: Diagnosis not present

## 2015-08-25 DIAGNOSIS — R300 Dysuria: Secondary | ICD-10-CM

## 2015-08-25 DIAGNOSIS — Z113 Encounter for screening for infections with a predominantly sexual mode of transmission: Secondary | ICD-10-CM

## 2015-08-25 DIAGNOSIS — F5104 Psychophysiologic insomnia: Secondary | ICD-10-CM

## 2015-08-25 DIAGNOSIS — N730 Acute parametritis and pelvic cellulitis: Secondary | ICD-10-CM | POA: Diagnosis not present

## 2015-08-25 DIAGNOSIS — G4700 Insomnia, unspecified: Secondary | ICD-10-CM

## 2015-08-25 LAB — URINALYSIS, ROUTINE W REFLEX MICROSCOPIC
Bilirubin Urine: NEGATIVE
Glucose, UA: NEGATIVE
Ketones, ur: NEGATIVE
Leukocytes, UA: NEGATIVE
Nitrite: NEGATIVE
Protein, ur: NEGATIVE
Specific Gravity, Urine: 1.025 (ref 1.001–1.035)
pH: 7 (ref 5.0–8.0)

## 2015-08-25 LAB — URINALYSIS, MICROSCOPIC ONLY
Casts: NONE SEEN [LPF]
Crystals: NONE SEEN [HPF]
Yeast: NONE SEEN [HPF]

## 2015-08-25 LAB — WET PREP FOR TRICH, YEAST, CLUE
Clue Cells Wet Prep HPF POC: NONE SEEN
Trich, Wet Prep: NONE SEEN
Yeast Wet Prep HPF POC: NONE SEEN

## 2015-08-25 MED ORDER — DOXYCYCLINE HYCLATE 100 MG PO TABS: 100.0000 mg | ORAL_TABLET | Freq: Two times a day (BID) | ORAL | Status: DC

## 2015-08-25 MED ORDER — LISDEXAMFETAMINE DIMESYLATE 60 MG PO CAPS: 60.0000 mg | ORAL_CAPSULE | ORAL | Status: DC

## 2015-08-25 MED ORDER — CEFTRIAXONE SODIUM 1 G IJ SOLR
250.0000 mg | Freq: Once | INTRAMUSCULAR | Status: AC
  Administered 2015-08-25: 250 mg via INTRAMUSCULAR

## 2015-08-25 MED ORDER — FLUCONAZOLE 150 MG PO TABS: 150.0000 mg | ORAL_TABLET | Freq: Once | ORAL | Status: DC

## 2015-08-25 MED ORDER — TRAZODONE HCL 50 MG PO TABS: 25.0000 mg | ORAL_TABLET | Freq: Every day | ORAL | Status: DC

## 2015-08-25 NOTE — Patient Instructions (Signed)
Call for psychiatry [phone] - [facility] Trazodone at bedtime We will call with lab results  Take antibiotics as prescribed
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
## 2015-08-26 LAB — GC/CHLAMYDIA PROBE AMP
CT Probe RNA: NOT DETECTED
GC Probe RNA: NOT DETECTED

## 2015-08-26 LAB — HSV(HERPES SMPLX)ABS-I+II(IGG+IGM)-BLD
HSV 1 Glycoprotein G Ab, IgG: 3.34 IV — ABNORMAL HIGH
HSV 2 Glycoprotein G Ab, IgG: <0.1 IV
Herpes Simplex Vrs I&II-IgM Ab (EIA): 0.61 INDEX

## 2015-08-26 LAB — HIV ANTIBODY (ROUTINE TESTING W REFLEX): HIV 1&2 Ab, 4th Generation: NONREACTIVE

## 2015-08-26 LAB — RPR

## 2015-08-26 MED FILL — SUBOXONE 12 MG-3 MG SL FILM: 12-3 | 14 days supply | Qty: 14 | Fill #0

## 2015-08-26 MED FILL — SUBOXONE 8 MG-2 MG SL FILM: 8-2 | 14 days supply | Qty: 14 | Fill #0

## 2015-09-08 ENCOUNTER — Other Ambulatory Visit: Payer: Self-pay | Admitting: *Deleted

## 2015-09-08 ENCOUNTER — Encounter: Payer: Self-pay | Admitting: Family Medicine

## 2015-09-08 ENCOUNTER — Ambulatory Visit (INDEPENDENT_AMBULATORY_CARE_PROVIDER_SITE_OTHER): Payer: Medicaid Other | Admitting: Family Medicine

## 2015-09-08 ENCOUNTER — Ambulatory Visit (HOSPITAL_COMMUNITY)
Admission: RE | Admit: 2015-09-08 | Discharge: 2015-09-08 | Disposition: A | Discharge status: Home / Self Care | Payer: Medicaid Other | Source: Ambulatory Visit | Attending: Family Medicine | Admitting: Family Medicine

## 2015-09-08 ENCOUNTER — Other Ambulatory Visit: Payer: Self-pay | Admitting: Family Medicine

## 2015-09-08 VITALS — BP 120/62 | HR 80 | Temp 99.4°F | Resp 16 | Ht 63.0 in | Wt 133.0 lb

## 2015-09-08 DIAGNOSIS — F319 Bipolar disorder, unspecified: Secondary | ICD-10-CM | POA: Diagnosis not present

## 2015-09-08 DIAGNOSIS — Z3201 Encounter for pregnancy test, result positive: Secondary | ICD-10-CM | POA: Diagnosis not present

## 2015-09-08 DIAGNOSIS — Z3A08 8 weeks gestation of pregnancy: Secondary | ICD-10-CM

## 2015-09-08 DIAGNOSIS — Z3200 Encounter for pregnancy test, result unknown: Secondary | ICD-10-CM

## 2015-09-08 LAB — PREGNANCY, URINE: Preg Test, Ur: POSITIVE — AB
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
## 2015-09-08 NOTE — Patient Instructions (Signed)
Get ultrasound today at 1:45pm Start prenatal vitamins Take B6-  once a day for nausea We will call with results and medication changes

## 2015-09-09 ENCOUNTER — Encounter: Payer: Self-pay | Admitting: Family Medicine

## 2015-09-09 MED ORDER — CONCEPT DHA 53.5-38-1 MG PO CAPS: ORAL_CAPSULE | ORAL | Status: DC

## 2015-09-09 NOTE — Assessment & Plan Note (Signed)
Continue wellbutrin, she will taper off of klonopin due to birth defects with use, given  Taper of 1/2 tab BID x 2 weeks, then 1/2 tab daily x 2 , then to stop  Will D/C vyamse now  With regards to suboxone GYN states okay to continue with this

## 2015-09-10 MED FILL — SUBOXONE 12 MG-3 MG SL FILM: 12-3 | 30 days supply | Qty: 30 | Fill #0

## 2015-09-10 MED FILL — SUBOXONE 8 MG-2 MG SL FILM: 8-2 | 30 days supply | Qty: 30 | Fill #0

## 2015-10-02 ENCOUNTER — Emergency Department
Admission: EM | Admit: 2015-10-02 | Discharge: 2015-10-02 | Disposition: A | Discharge status: Home / Self Care | Payer: Medicaid Other | Attending: Emergency Medicine | Admitting: Emergency Medicine

## 2015-10-02 ENCOUNTER — Emergency Department: Payer: Medicaid Other

## 2015-10-02 ENCOUNTER — Encounter: Payer: Self-pay | Admitting: Emergency Medicine

## 2015-10-02 DIAGNOSIS — F17210 Nicotine dependence, cigarettes, uncomplicated: Secondary | ICD-10-CM | POA: Diagnosis not present

## 2015-10-02 DIAGNOSIS — N939 Abnormal uterine and vaginal bleeding, unspecified: Secondary | ICD-10-CM | POA: Diagnosis not present

## 2015-10-02 DIAGNOSIS — F1123 Opioid dependence with withdrawal: Secondary | ICD-10-CM | POA: Diagnosis not present

## 2015-10-02 DIAGNOSIS — N3090 Cystitis, unspecified without hematuria: Secondary | ICD-10-CM | POA: Diagnosis not present

## 2015-10-02 DIAGNOSIS — R300 Dysuria: Secondary | ICD-10-CM | POA: Diagnosis present

## 2015-10-02 DIAGNOSIS — R102 Pelvic and perineal pain: Secondary | ICD-10-CM

## 2015-10-02 LAB — URINALYSIS COMPLETE WITH MICROSCOPIC (ARMC ONLY)
Bilirubin Urine: NEGATIVE
Glucose, UA: NEGATIVE mg/dL
Ketones, ur: NEGATIVE mg/dL
Nitrite: NEGATIVE
Protein, ur: 100 mg/dL — AB
Specific Gravity, Urine: 1.012 (ref 1.005–1.030)
pH: 7 (ref 5.0–8.0)

## 2015-10-02 LAB — WET PREP, GENITAL
Clue Cells Wet Prep HPF POC: NONE SEEN
Sperm: NONE SEEN
Trich, Wet Prep: NONE SEEN
Yeast Wet Prep HPF POC: NONE SEEN

## 2015-10-02 LAB — COMPREHENSIVE METABOLIC PANEL
ALT: 40 U/L (ref 14–54)
AST: 48 U/L — ABNORMAL HIGH (ref 15–41)
Albumin: 3.5 g/dL (ref 3.5–5.0)
Alkaline Phosphatase: 88 U/L (ref 38–126)
Anion gap: 5 (ref 5–15)
BUN: 8 mg/dL (ref 6–20)
CO2: 27 mmol/L (ref 22–32)
Calcium: 9 mg/dL (ref 8.9–10.3)
Chloride: 109 mmol/L (ref 101–111)
Creatinine, Ser: 0.85 mg/dL (ref 0.44–1.00)
GFR calc Af Amer: >60 mL/min (ref >60)
GFR calc non Af Amer: >60 mL/min (ref >60)
Glucose, Bld: 112 mg/dL — ABNORMAL HIGH (ref 65–99)
Potassium: 4.7 mmol/L (ref 3.5–5.1)
Sodium: 141 mmol/L (ref 135–145)
Total Bilirubin: 0.5 mg/dL (ref 0.3–1.2)
Total Protein: 7.5 g/dL (ref 6.5–8.1)

## 2015-10-02 LAB — CHLAMYDIA/NGC RT PCR (ARMC ONLY)
Chlamydia Tr: NOT DETECTED
N gonorrhoeae: NOT DETECTED

## 2015-10-02 LAB — CBC
HCT: 37.6 % (ref 35.0–47.0)
Hemoglobin: 12.8 g/dL (ref 12.0–16.0)
MCH: 31.8 pg (ref 26.0–34.0)
MCHC: 34 g/dL (ref 32.0–36.0)
MCV: 93.5 fL (ref 80.0–100.0)
Platelets: 389 10*3/uL (ref 150–440)
RBC: 4.02 MIL/uL (ref 3.80–5.20)
RDW: 13 % (ref 11.5–14.5)
WBC: 9.3 10*3/uL (ref 3.6–11.0)

## 2015-10-02 LAB — RAPID HIV SCREEN (HIV 1/2 AB+AG)
HIV 1/2 Antibodies: NONREACTIVE
HIV-1 P24 Antigen - HIV24: NONREACTIVE

## 2015-10-02 LAB — HCG, QUANTITATIVE, PREGNANCY: hCG, Beta Chain, Quant, S: 98 m[IU]/mL — ABNORMAL HIGH (ref <5)

## 2015-10-02 MED ORDER — DIAZEPAM 5 MG PO TABS
5.0000 mg | ORAL_TABLET | Freq: Once | ORAL | Status: AC
  Administered 2015-10-02: 5 mg via ORAL
  Filled 2015-10-02: qty 1

## 2015-10-02 MED ORDER — IBUPROFEN 800 MG PO TABS
800.0000 mg | ORAL_TABLET | Freq: Once | ORAL | Status: AC
  Administered 2015-10-02: 800 mg via ORAL
  Filled 2015-10-02: qty 1

## 2015-10-02 MED ORDER — SULFAMETHOXAZOLE-TRIMETHOPRIM 800-160 MG PO TABS: 1.0000 | ORAL_TABLET | Freq: Two times a day (BID) | ORAL | Status: DC

## 2015-10-02 MED ORDER — SULFAMETHOXAZOLE-TRIMETHOPRIM 800-160 MG PO TABS
1.0000 | ORAL_TABLET | Freq: Once | ORAL | Status: AC
  Administered 2015-10-02: 1 via ORAL
  Filled 2015-10-02: qty 1

## 2015-10-02 MED ORDER — PHENAZOPYRIDINE HCL 200 MG PO TABS: 200.0000 mg | ORAL_TABLET | Freq: Three times a day (TID) | ORAL | Status: DC | PRN

## 2015-10-02 MED ORDER — ONDANSETRON 4 MG PO TBDP
4.0000 mg | ORAL_TABLET | Freq: Once | ORAL | Status: AC
  Administered 2015-10-02: 4 mg via ORAL
  Filled 2015-10-02: qty 1

## 2015-10-02 NOTE — Progress Notes (Signed)
Per request of ER MD Mayford Knife), writer provided the pt. with information and instructions on how to access Outpatient Mental Health & Substance Abuse Treatment ([facility] and [employer]) Pt was also given a resources list to assist with seeking SA services.   10/02/2015 Cheryl Flash, MS, NCC, [facility] Therapeutic Triage Specialist

## 2015-10-02 NOTE — ED Notes (Signed)
States began odor to urine 3 days, pain with urination. States had TAB 10 days ago, had large clots after but now has dropped vaginal bleeding to spotting.

## 2015-10-02 NOTE — Discharge Instructions (Signed)

## 2015-10-02 NOTE — ED Provider Notes (Addendum)
[facility] Emergency Department Provider Note     Time seen: ----------------------------------------- 1:53 PM on 10/02/2015 -----------------------------------------    I have reviewed the triage vital signs and the nursing notes.   HISTORY  Chief Complaint Dysuria    HPI Kimberly Barrera is a 25 y.o. female who presents the ER for dysuria. Patient's had dysuria for 3 days, had an abortion 10 days ago had large clots and bleeding afterwards but now is only having spotting. She is having a lower abdominal pressure and pain when she urinates. Patient has been using heroin ever since the abortion. She denies fevers chills or other complaints.   Past Medical History  Diagnosis Date  . Depression   . ADHD (attention deficit hyperactivity disorder)   . Anxiety   . Substance abuse   . Bipolar 1 disorder [facility])     Patient Active Problem List   Diagnosis Date Noted  . Substance abuse 05/24/2015  . History of migraine with aura 04/26/2015  . History of substance abuse 04/04/2015  . Chronic back pain 04/04/2015  . Contraception management 04/04/2015  . Bipolar 1 disorder, depressed (HCC) 04/02/2015  . ADD (attention deficit disorder) 04/02/2015  . Chronic insomnia 04/02/2015  . Anxiety, mild 03/06/2012    Past Surgical History  Procedure Laterality Date  . Wisdom tooth extraction      Allergies Review of patient's allergies indicates no known allergies.  Social History Social History  Substance Use Topics  . Smoking status: Current Every Day Smoker -- 1.00 packs/day    Types: Cigarettes    Last Attempt to Quit: 04/20/2011  . Smokeless tobacco: Never Used  . Alcohol Use: No     Comment: occasionally    Review of Systems Constitutional: Negative for fever. Eyes: Negative for visual changes. ENT: Negative for sore throat. Cardiovascular: Negative for chest pain. Respiratory: Negative for shortness of breath. Gastrointestinal:  Positive for abdominal pain, negative for vomiting and diarrhea. Genitourinary: Positive for dysuria, vaginal bleeding Musculoskeletal: Negative for back pain. Skin: Negative for rash. Neurological: Negative for headaches, focal weakness or numbness.  10-point ROS otherwise negative.  ____________________________________________   PHYSICAL EXAM:  VITAL SIGNS: ED Triage Vitals  Enc Vitals Group     BP 10/02/15 1007 119/76 mmHg     Pulse Rate 10/02/15 1007 93     Resp 10/02/15 1007 20     Temp 10/02/15 1007 97.7 F (36.5 C)     Temp Source 10/02/15 1007 Oral     SpO2 10/02/15 1007 97 %     Weight 10/02/15 1007 130 lb (58.968 kg)     Height 10/02/15 1007  (1.651 m)     Head Cir --      Peak Flow --      Pain Score 10/02/15 1009 7     Pain Loc --      Pain Edu? --      Excl. in GC? --     Constitutional: Alert and oriented. Well appearing and in no distress. Eyes: Conjunctivae are normal. PERRL. Normal extraocular movements. ENT   Head: Normocephalic and atraumatic.   Nose: No congestion/rhinnorhea.   Mouth/Throat: Mucous membranes are moist.   Neck: No stridor. Cardiovascular: Normal rate, regular rhythm. Normal and symmetric distal pulses are present in all extremities. No murmurs, rubs, or gallops. Respiratory: Normal respiratory effort without tachypnea nor retractions. Breath sounds are clear and equal bilaterally. No wheezes/rales/rhonchi. Gastrointestinal: Lower abdominal tenderness, no rebound or guarding. Normal bowel sounds.  Genitourinary: Cervix is unremarkable although tender, minimal recent bleeding, no active bleeding. Musculoskeletal: Nontender with normal range of motion in all extremities. No joint effusions.  No lower extremity tenderness nor edema. Neurologic:  Normal speech and language. No gross focal neurologic deficits are appreciated. Speech is normal. No gait instability. Skin:  Skin is warm, dry and intact. Tract marks noted to the  left before meals Psychiatric: Mood and affect are normal. Speech and behavior are normal. Patient exhibits appropriate insight and judgment. ____________________________________________  ED COURSE:  Pertinent labs & imaging results that were available during my care of the patient were reviewed by me and considered in my medical decision making (see chart for details). Patient is in no acute distress, need ultrasound to assess for retained products of conception. ____________________________________________    LABS (pertinent positives/negatives)  Labs Reviewed  WET PREP, GENITAL - Abnormal; Notable for the following:    WBC, Wet Prep HPF POC FEW (*)    All other components within normal limits  COMPREHENSIVE METABOLIC PANEL - Abnormal; Notable for the following:    Glucose, Bld 112 (*)    AST 48 (*)    All other components within normal limits  URINALYSIS COMPLETEWITH MICROSCOPIC ([facility] ONLY) - Abnormal; Notable for the following:    Color, Urine AMBER (*)    APPearance CLOUDY (*)    Hgb urine dipstick 3+ (*)    Protein, ur 100 (*)    Leukocytes, UA 3+ (*)    Bacteria, UA MANY (*)    Squamous Epithelial / LPF 6-30 (*)    All other components within normal limits  HCG, QUANTITATIVE, PREGNANCY - Abnormal; Notable for the following:    hCG, Beta Chain, Quant, S 98 (*)    All other components within normal limits  CHLAMYDIA/NGC RT PCR ([facility] ONLY)  CBC  RAPID HIV SCREEN (HIV 1/2 AB+AG)    RADIOLOGY  Pelvic ultrasound IMPRESSION: Normal pelvic ultrasound. No intrauterine or extrauterine gestation seen. No retained products of conception.  ____________________________________________  FINAL ASSESSMENT AND PLAN  Cystitis, heroin withdrawal, recent abortion  Plan: Patient with labs and imaging as dictated above. Ultrasound is negative as dictated above. She'll be treated with Bactrim and Pyridium for her UTI. She is stable to return to detox.   Emily Filbert, MD   Emily Filbert, MD 10/02/15 1524  Emily Filbert, MD 10/02/15 [phone]

## 2015-10-05 MED FILL — SUBOXONE 12 MG-3 MG SL FILM: 12-3 | 28 days supply | Qty: 28 | Fill #0

## 2015-10-05 MED FILL — SUBOXONE 8 MG-2 MG SL FILM: 8-2 | 28 days supply | Qty: 28 | Fill #0

## 2015-10-11 ENCOUNTER — Ambulatory Visit (INDEPENDENT_AMBULATORY_CARE_PROVIDER_SITE_OTHER): Payer: Medicaid Other | Admitting: Adult Health

## 2015-10-11 ENCOUNTER — Encounter: Payer: Self-pay | Admitting: Adult Health

## 2015-10-11 ENCOUNTER — Ambulatory Visit: Payer: Medicaid Other | Admitting: Women's Health

## 2015-10-11 VITALS — BP 128/70 | HR 48 | Ht 65.0 in | Wt 127.5 lb

## 2015-10-11 DIAGNOSIS — Z3009 Encounter for other general counseling and advice on contraception: Secondary | ICD-10-CM | POA: Diagnosis not present

## 2015-10-11 DIAGNOSIS — N898 Other specified noninflammatory disorders of vagina: Secondary | ICD-10-CM | POA: Diagnosis not present

## 2015-10-11 DIAGNOSIS — R309 Painful micturition, unspecified: Secondary | ICD-10-CM

## 2015-10-11 DIAGNOSIS — Z30018 Encounter for initial prescription of other contraceptives: Secondary | ICD-10-CM | POA: Diagnosis not present

## 2015-10-11 DIAGNOSIS — R319 Hematuria, unspecified: Secondary | ICD-10-CM | POA: Diagnosis not present

## 2015-10-11 HISTORY — DX: Other specified noninflammatory disorders of vagina: N89.8

## 2015-10-11 HISTORY — DX: Hematuria, unspecified: R31.9

## 2015-10-11 HISTORY — DX: Painful micturition, unspecified: R30.9

## 2015-10-11 LAB — POCT WET PREP (WET MOUNT)
Clue Cells Wet Prep Whiff POC: NEGATIVE
WBC, Wet Prep HPF POC: POSITIVE

## 2015-10-11 LAB — POCT URINALYSIS DIPSTICK

## 2015-10-11 NOTE — Progress Notes (Signed)
Subjective:     Patient ID: Kimberly Barrera, female   DOB: 11/11/1990, 25 y.o.   MRN: [id number]  HPI Kimberly Barrera is a 25 year old white female, G3P2A1 in complaining of burning with urination, was treated in ER at [facility] for UTI with bactrum,she is using pyridium and it helps.She had el ab 09/22/15.She wants to get the nexplanon for birth control has used in the past.(Has history of depression, ADHD and bipolar and is on meds) PCP is Dr Jeanice Lim.  Review of Systems Patient denies any headaches, hearing loss, fatigue, blurred vision, shortness of breath, chest pain, abdominal pain, problems with bowel movements, or intercourse(not having sex),No joint pain or mood swings.See HPI for positives. Reviewed past medical,surgical, social and family history. Reviewed medications and allergies.     Objective:   Physical Exam BP 128/70 mmHg  Pulse 48  Ht  (1.651 m)  Wt 127 lb 8 oz (57.834 kg)  BMI 21.22 kg/m2  LMP  urine 1+ blood, has taken AZO is orange, Skin warm and dry.Pelvic: external genitalia is normal in appearance no lesions, vagina: tan discharge without odor,urethra has no lesions or masses noted, cervix:smooth and bulbous, uterus: normal size, shape and contour, non tender, no masses felt, adnexa: no masses or tenderness noted. Bladder is mildly tender and no masses felt. Wet prep: + for WBC.She had negative GC/CHL 2/18 in ER at [facility], and has not had sex.Discussed nexplanon and she wants it, she says may not take pills correctly.Will send urine for UA C&S and push fluids. Face time 20 minutes with 50% counseling.    Assessment:     Pain with urination Vaginal discharge Hematuria Contraceptive management and ed.    Plan:    Push fluids UA C&S sent. Check QHCG today Order Nexplanon today No sex, return in 3 weeks for stat QHCg in am and nexplanon insertion in pm, if starts period call Review handout on nexplanon

## 2015-10-11 NOTE — Patient Instructions (Signed)
No sex Return in 3 weeks for nexplanon

## 2015-10-12 ENCOUNTER — Telehealth: Payer: Self-pay | Admitting: Adult Health

## 2015-10-12 LAB — URINALYSIS, ROUTINE W REFLEX MICROSCOPIC
Bilirubin, UA: NEGATIVE
Glucose, UA: NEGATIVE
Nitrite, UA: POSITIVE — AB
Specific Gravity, UA: 1.02 (ref 1.005–1.030)
Urobilinogen, Ur: 4 mg/dL — ABNORMAL HIGH (ref 0.2–1.0)
pH, UA: 6 (ref 5.0–7.5)

## 2015-10-12 LAB — MICROSCOPIC EXAMINATION
Casts: NONE SEEN /lpf
Epithelial Cells (non renal): >10 /hpf — AB (ref 0–10)
WBC, UA: >30 /hpf — AB (ref 0–?)

## 2015-10-12 LAB — BETA HCG QUANT (REF LAB): hCG Quant: 22 m[IU]/mL

## 2015-10-12 MED ORDER — NITROFURANTOIN MONOHYD MACRO 100 MG PO CAPS: 100.0000 mg | ORAL_CAPSULE | Freq: Two times a day (BID) | ORAL | Status: DC

## 2015-10-12 NOTE — Telephone Encounter (Signed)
Left message QHCG 22, and looks like has UTI, will rx macrobid, check with drug store

## 2015-10-13 LAB — URINE CULTURE

## 2015-11-01 ENCOUNTER — Other Ambulatory Visit: Payer: Medicaid Other

## 2015-11-01 ENCOUNTER — Encounter: Payer: Medicaid Other | Admitting: Adult Health

## 2015-11-02 ENCOUNTER — Ambulatory Visit: Payer: Medicaid Other | Admitting: Family Medicine

## 2015-11-04 ENCOUNTER — Ambulatory Visit (INDEPENDENT_AMBULATORY_CARE_PROVIDER_SITE_OTHER): Payer: Medicaid Other | Admitting: Physician Assistant

## 2015-11-04 ENCOUNTER — Encounter: Payer: Self-pay | Admitting: Physician Assistant

## 2015-11-04 ENCOUNTER — Encounter: Payer: Medicaid Other | Admitting: Adult Health

## 2015-11-04 VITALS — BP 112/72 | HR 68 | Temp 98.2°F | Resp 18 | Wt 135.0 lb

## 2015-11-04 DIAGNOSIS — F988 Other specified behavioral and emotional disorders with onset usually occurring in childhood and adolescence: Secondary | ICD-10-CM

## 2015-11-04 DIAGNOSIS — F909 Attention-deficit hyperactivity disorder, unspecified type: Secondary | ICD-10-CM | POA: Diagnosis not present

## 2015-11-04 DIAGNOSIS — F1911 Other psychoactive substance abuse, in remission: Secondary | ICD-10-CM

## 2015-11-04 DIAGNOSIS — Z87898 Personal history of other specified conditions: Secondary | ICD-10-CM | POA: Diagnosis not present

## 2015-11-04 DIAGNOSIS — F319 Bipolar disorder, unspecified: Secondary | ICD-10-CM

## 2015-11-04 MED ORDER — LISDEXAMFETAMINE DIMESYLATE 60 MG PO CAPS: 60.0000 mg | ORAL_CAPSULE | ORAL | Status: DC

## 2015-11-04 MED FILL — SUBOXONE 12 MG-3 MG SL FILM: 12-3 | 14 days supply | Qty: 14 | Fill #0

## 2015-11-04 MED FILL — SUBOXONE 8 MG-2 MG SL FILM: 8-2 | 14 days supply | Qty: 14 | Fill #0

## 2015-11-04 NOTE — Progress Notes (Signed)
Patient ID: ENRIKA AGUADO MRN: [id number], DOB: March 24, 1991, 25 y.o. Date of Encounter: @  Chief Complaint:  Chief Complaint  Patient presents with  . refill ADD meds    mom threw March Rx away  . Medication Refill    HPI: 25 y.o. year old female  presents with above.   I reviewed notes. Viewed notes from [facility] 10/02/15. Note stated the patient had abortion 10 days prior had large clots and bleeding afterwards but at that time was just having spotting. Note also states patient has been using heroin ever since the abortion.  Also reviewed note with OB/GYN 10/11/15 which time she was wanting to get explant on for birth control.  Also reviewed Dr. Deirdre Peer note from 08/25/15.  Also review Dr. Deirdre Peer note from 09/08/15. At that visit patient had positive pregnancy test. At that visit Dr. Jeanice Lim plan to wean off Klonopin secondary to pregnancy. Stop Vyvanse secondary to pregnancy.  Today patient is here to get back on medications as she is not longer pregnant.   Past Medical History  Diagnosis Date  . Depression   . ADHD (attention deficit hyperactivity disorder)   . Anxiety   . Substance abuse   . Bipolar 1 disorder (HCC)   . Abortion   . Pain with urination 10/11/2015  . Vaginal discharge 10/11/2015  . Hematuria 10/11/2015  . Contraceptive education 10/11/2015     Home Meds: Outpatient Prescriptions Prior to Visit  Medication Sig Dispense Refill  . Buprenorphine HCl-Naloxone HCl (SUBOXONE) 12-3 MG FILM Place under the tongue daily.    Marland Kitchen buPROPion (WELLBUTRIN XL) 150 MG 24 hr tablet Take 1 tablet daily for 2 weeks, then increase to  (Patient taking differently: 150 mg as needed. ) 45 tablet 0  . clindamycin-benzoyl peroxide (BENZACLIN) gel Apply topically 2 (two) times daily. (Patient taking differently: Apply 1 application topically daily. ) 25 g 3  . clonazePAM (KLONOPIN) 0.5 MG tablet Take 0.5 mg by mouth 2 (two) times daily.    .  nitrofurantoin, macrocrystal-monohydrate, (MACROBID) 100 MG capsule Take 1 capsule (100 mg total) by mouth 2 (two) times daily. 14 capsule 0  . phenazopyridine (PYRIDIUM) 200 MG tablet Take 1 tablet (200 mg total) by mouth 3 (three) times daily as needed for pain. 20 tablet 0   No facility-administered medications prior to visit.    Allergies: No Known Allergies  Social History   Social History  . Marital Status: Single    Spouse Name: N/A  . Number of Children: N/A  . Years of Education: N/A   Occupational History  . Not on file.   Social History Main Topics  . Smoking status: Current Every Day Smoker -- 1.00 packs/day for 8 years    Types: Cigarettes    Last Attempt to Quit: 04/20/2011  . Smokeless tobacco: Never Used  . Alcohol Use: No  . Drug Use: No  . Sexual Activity: Not Currently    Birth Control/ Protection: None   Other Topics Concern  . Not on file   Social History Narrative    Family History  Problem Relation Age of Onset  . Hypertension Father   . COPD Maternal Grandmother   . Asthma Maternal Grandmother      Review of Systems:  See HPI for pertinent ROS. All other ROS negative.    Physical Exam: Blood pressure 112/72, pulse 68, temperature 98.2 F (36.8 C), temperature source Oral, resp. rate 18, weight 135 lb (61.236 kg).,  Body mass index is 22.47 kg/(m^2). General: WNWD WF. Appears in no acute distress. Neck: Supple. No thyromegaly. No lymphadenopathy. Lungs: Clear bilaterally to auscultation without wheezes, rales, or rhonchi. Breathing is unlabored. Heart: RRR with S1 S2. No murmurs, rubs, or gallops. Musculoskeletal:  Strength and tone normal for age. Extremities/Skin: Warm and dry.  Neuro: Alert and oriented X 3. Moves all extremities spontaneously. Gait is normal. CNII-XII grossly in tact. Psych:  Responds to questions appropriately with a normal affect.     ASSESSMENT AND PLAN:  25 y.o. year old female with  1. ADD (attention deficit  disorder) - lisdexamfetamine (VYVANSE) 60 MG capsule; Take 1 capsule (60 mg total) by mouth every morning.  Dispense: 30 capsule; Refill: 0  2. Bipolar 1 disorder, depressed (HCC)  3. History of substance abuse  Today I printed one month supply of Vyvanse. Told her to have f/u OV with Dr. Jeanice Limurham before that month is up.   She then asked for refill on the Klonopin as well. I asked her if she sees psychiatry for Wellbutrin and psych meds and she states no she gets them all from Dr. Jeanice Limurham. Told her I would review her chart and would call the Klonopin in if indictated.  After reviewing records, I will NOT prescribe any further meds.  Will have Dr. Jeanice Limurham review tomorrow and manage.    [address]igned, Mary Beth [place], [place], [facility] 11/04/2015 4:56 PM

## 2015-11-05 ENCOUNTER — Telehealth: Payer: Self-pay | Admitting: Family Medicine

## 2015-11-05 MED ORDER — BUPROPION HCL ER (XL) 300 MG PO TB24: ORAL_TABLET | ORAL | Status: DC

## 2015-11-05 MED ORDER — TRAZODONE HCL 50 MG PO TABS: 25.0000 mg | ORAL_TABLET | Freq: Every day | ORAL | Status: DC

## 2015-11-05 NOTE — Telephone Encounter (Signed)
MD please advise

## 2015-11-05 NOTE — Telephone Encounter (Signed)
I can not refill her Klonopin as she has been on other street drugs (Heroin), this was discussed with her at the office visit You can refill Trazodone and wellbutrin She needs to go back to the suboxone clinic for further treatment, I also recommend she go see the psychiatrist, [facility] can [facility] if needed

## 2015-11-05 NOTE — Telephone Encounter (Addendum)
Pt is requesting a refill of Trazadone, Wellbuterin and [facility]opin [facility] [address]. Did not see Trazadone on med list. Pt states that prescriptions should have been called in after her visit on 3/23 with MB. [phone]

## 2015-11-05 NOTE — Telephone Encounter (Signed)
Call placed to patient and patient made aware.   Patient became very upset in regards to Klonopin being denied. Made aware of MD recommendations.   Prescriptions sent to pharmacy.

## 2015-11-09 ENCOUNTER — Encounter: Payer: Self-pay | Admitting: Family Medicine

## 2015-11-10 ENCOUNTER — Encounter: Payer: Self-pay | Admitting: Obstetrics and Gynecology

## 2015-11-10 ENCOUNTER — Ambulatory Visit (INDEPENDENT_AMBULATORY_CARE_PROVIDER_SITE_OTHER): Payer: Medicaid Other | Admitting: Obstetrics and Gynecology

## 2015-11-10 VITALS — BP 120/70 | Ht 65.0 in

## 2015-11-10 DIAGNOSIS — Z3202 Encounter for pregnancy test, result negative: Secondary | ICD-10-CM

## 2015-11-10 DIAGNOSIS — Z3200 Encounter for pregnancy test, result unknown: Secondary | ICD-10-CM

## 2015-11-10 DIAGNOSIS — Z3046 Encounter for surveillance of implantable subdermal contraceptive: Secondary | ICD-10-CM

## 2015-11-10 DIAGNOSIS — R300 Dysuria: Secondary | ICD-10-CM | POA: Diagnosis not present

## 2015-11-10 LAB — POCT URINALYSIS DIPSTICK
Blood, UA: NEGATIVE
Glucose, UA: NEGATIVE
Ketones, UA: NEGATIVE
Leukocytes, UA: NEGATIVE
Nitrite, UA: NEGATIVE
Protein, UA: NEGATIVE

## 2015-11-10 LAB — POCT URINE PREGNANCY: Preg Test, Ur: NEGATIVE

## 2015-11-10 NOTE — Progress Notes (Signed)
Patient ID: Kimberly MiresBrittany G Barrera, female   DOB: 09/25/1990, 25 y.o.   MRN: [id number]  NEXPLANON INSERTION NOTE  Patient given informed consent, signed copy in the chart, time out was performed. Pregnancy test was negative. Appropriate time out taken.  Patient's left arm was prepped and draped in the usual sterile fashion. The ruler used to measure and mark insertion area.  Pt was prepped with alcohol swab and then injected with 1.5 cc of 1 % lidocaine.  Pt was prepped with betadine, Nexplanon removed form packaging. Then inserted per standard guidelines. Patient and provider were able to palpate rod under skin. Pt insertion site covered with sterile dressing.   Minimal blood loss.  Pt tolerated the procedure well.    By signing my name below, I, Ronney LionSuzanne Le, attest that this documentation has been prepared under the direction and in the presence of Tilda BurrowJohn V Galya Dunnigan, MD. Electronically Signed: Ronney LionSuzanne Le, ED Scribe. 11/10/2015. 4:30 PM.  I personally performed the services described in this documentation, which was SCRIBED in my presence. The recorded information has been reviewed and considered accurate. It has been edited as necessary during review. Tilda BurrowFERGUSON,Mikiah Durall V, MD

## 2015-11-12 ENCOUNTER — Ambulatory Visit: Payer: Medicaid Other | Admitting: Family Medicine

## 2015-11-18 MED FILL — SUBOXONE 8 MG-2 MG SL FILM: 8-2 | 28 days supply | Qty: 28 | Fill #0

## 2015-11-18 MED FILL — SUBOXONE 12 MG-3 MG SL FILM: 12-3 | 28 days supply | Qty: 28 | Fill #0

## 2015-11-19 ENCOUNTER — Encounter: Payer: Self-pay | Admitting: Family Medicine

## 2015-11-19 ENCOUNTER — Telehealth: Payer: Self-pay | Admitting: Family Medicine

## 2015-11-19 NOTE — Telephone Encounter (Signed)
Pt is requesting an appt with Dr. Jeanice Limurham. She received a letter stating if she missed another appt that she would be dismissed from the practice, which she later no-showed for her appt on 3/31. Still ok to schedule?

## 2015-11-19 NOTE — Telephone Encounter (Signed)
Okay to schedule an appointment, if she does not show up, we will have to discharge her

## 2015-11-22 NOTE — Telephone Encounter (Signed)
Patient made an appointment for tomorrow and she is aware that if she doesn't show up she will be discharged from the practice.

## 2015-11-23 ENCOUNTER — Encounter: Payer: Self-pay | Admitting: Family Medicine

## 2015-11-23 ENCOUNTER — Ambulatory Visit (INDEPENDENT_AMBULATORY_CARE_PROVIDER_SITE_OTHER): Payer: Medicaid Other | Admitting: Family Medicine

## 2015-11-23 VITALS — BP 124/68 | HR 70 | Temp 98.7°F | Resp 14 | Ht 65.0 in | Wt 135.0 lb

## 2015-11-23 DIAGNOSIS — F909 Attention-deficit hyperactivity disorder, unspecified type: Secondary | ICD-10-CM

## 2015-11-23 DIAGNOSIS — F319 Bipolar disorder, unspecified: Secondary | ICD-10-CM | POA: Diagnosis not present

## 2015-11-23 DIAGNOSIS — F1910 Other psychoactive substance abuse, uncomplicated: Secondary | ICD-10-CM | POA: Diagnosis not present

## 2015-11-23 DIAGNOSIS — F988 Other specified behavioral and emotional disorders with onset usually occurring in childhood and adolescence: Secondary | ICD-10-CM

## 2015-11-23 MED ORDER — BUPROPION HCL ER (XL) 300 MG PO TB24: ORAL_TABLET | ORAL | Status: DC

## 2015-11-23 MED ORDER — LISDEXAMFETAMINE DIMESYLATE 60 MG PO CAPS: 60.0000 mg | ORAL_CAPSULE | ORAL | Status: DC

## 2015-11-23 MED ORDER — TRAZODONE HCL 50 MG PO TABS: 25.0000 mg | ORAL_TABLET | Freq: Every day | ORAL | Status: DC

## 2015-11-23 NOTE — Assessment & Plan Note (Signed)
Therefore she continues to use substances hopefully she will stay clean this time. She is getting therapy as well. She will continue the Wellbutrin we will plan to titrate up to 300 mg after another week which was her previous dose. We will continue the trazodone and the Vyvanse. I will not prescribe any benzodiazepines for her. We discussed that if she does not change her behavior that she is going to lose her children will be put into the system. Unfortunately her mother is also very fed up with her behavior and they do not have a good relationship which was her only support group here.

## 2015-11-23 NOTE — Patient Instructions (Addendum)
Complete another week of wellbutrin 150mg  Then increase 300mg   Take trazodone 1 hour before bedtime  F/U 1 month

## 2015-11-23 NOTE — Progress Notes (Signed)
Patient ID: Kimberly Barrera, female   DOB: 03/25/1991, 25 y.o.   MRN: [id number]    Subjective:    Patient ID: Kimberly MiresBrittany G Barrera, female    DOB: 05/25/1991, 25 y.o.   MRN: [id number]  Patient presents for Medication Review/ Refill Patient here to discuss medications. Unfortunately since her last visit she had [place] she did have infection and significant bleeding afterwards. She was seen at the local hospital and treated. Of note after that she wanted to a depression and psychological state and she started using heroin again. For the past 2 weeks however she has been clean she is back at her Suboxone clinic and they have adjusted her medications. She is now back on Wellbutrin 150 mg which she's been taking for the past 10 days she also uses trazodone 25-50 mg at night as needed. She is aware that I'm not I prescribed her Klonopin because of her recent heroin use. Of note she is also on Vyvanse and her Suboxone doctor is aware of this. She is trying to get another job and states that she has a second interview that she needs to schedule.    Review Of Systems:  GEN- denies fatigue, fever, weight loss,weakness, recent illness HEENT- denies eye drainage, change in vision, nasal discharge, CVS- denies chest pain, palpitations RESP- denies SOB, cough, wheeze ABD- denies N/V, change in stools, abd pain GU- denies dysuria, hematuria, dribbling, incontinence MSK- denies joint pain, muscle aches, injury Neuro- denies headache, dizziness, syncope, seizure activity       Objective:    BP 124/68 mmHg  Pulse 70  Temp(Src) 98.7 F (37.1 C) (Oral)  Resp 14  Ht 5\' 5"  (1.651 m)  Wt 135 lb (61.236 kg)  BMI 22.47 kg/m2  LMP 10/27/2015 GEN- NAD, alert and oriented x3 Psych- not depressed, irritated with children in room at times, not anxious, no shakes, no halluicnations, normal speech        Assessment & Plan:      Problem List Items Addressed This Visit    Substance abuse   Bipolar 1 disorder, depressed (HCC)    Therefore she continues to use substances hopefully she will stay clean this time. She is getting therapy as well. She will continue the Wellbutrin we will plan to titrate up to 300 mg after another week which was her previous dose. We will continue the trazodone and the Vyvanse. I will not prescribe any benzodiazepines for her. We discussed that if she does not change her behavior that she is going to lose her children will be put into the system. Unfortunately her mother is also very fed up with her behavior and they do not have a good relationship which was her only support group here.      Relevant Medications   buPROPion (WELLBUTRIN XL) 300 MG 24 hr tablet   traZODone (DESYREL) 50 MG tablet   ADD (attention deficit disorder) - Primary   Relevant Medications   lisdexamfetamine (VYVANSE) 60 MG capsule      Note: This dictation was prepared with Dragon dictation along with smaller phrase technology. Any transcriptional errors that result from this process are unintentional.

## 2015-12-01 ENCOUNTER — Ambulatory Visit: Payer: Medicaid Other | Admitting: Adult Health

## 2015-12-16 MED FILL — SUBOXONE 12 MG-3 MG SL FILM: 12-3 | 28 days supply | Qty: 56 | Fill #0

## 2016-01-14 ENCOUNTER — Encounter: Payer: Self-pay | Admitting: Family Medicine

## 2016-01-14 ENCOUNTER — Ambulatory Visit (INDEPENDENT_AMBULATORY_CARE_PROVIDER_SITE_OTHER): Payer: Medicaid Other | Admitting: Family Medicine

## 2016-01-14 VITALS — BP 118/62 | HR 68 | Temp 98.9°F | Resp 16 | Ht 65.0 in | Wt 132.0 lb

## 2016-01-14 DIAGNOSIS — G5690 Unspecified mononeuropathy of unspecified upper limb: Secondary | ICD-10-CM | POA: Diagnosis not present

## 2016-01-14 DIAGNOSIS — B852 Pediculosis, unspecified: Secondary | ICD-10-CM

## 2016-01-14 DIAGNOSIS — F988 Other specified behavioral and emotional disorders with onset usually occurring in childhood and adolescence: Secondary | ICD-10-CM

## 2016-01-14 DIAGNOSIS — N898 Other specified noninflammatory disorders of vagina: Secondary | ICD-10-CM | POA: Diagnosis not present

## 2016-01-14 DIAGNOSIS — A5901 Trichomonal vulvovaginitis: Secondary | ICD-10-CM | POA: Diagnosis not present

## 2016-01-14 DIAGNOSIS — Z113 Encounter for screening for infections with a predominantly sexual mode of transmission: Secondary | ICD-10-CM | POA: Diagnosis not present

## 2016-01-14 DIAGNOSIS — F909 Attention-deficit hyperactivity disorder, unspecified type: Secondary | ICD-10-CM | POA: Diagnosis not present

## 2016-01-14 MED ORDER — LISDEXAMFETAMINE DIMESYLATE 60 MG PO CAPS: 60.0000 mg | ORAL_CAPSULE | ORAL | Status: DC

## 2016-01-14 MED ORDER — IVERMECTIN 0.5 % EX LOTN: TOPICAL_LOTION | CUTANEOUS | Status: DC

## 2016-01-14 MED ORDER — CEFTRIAXONE SODIUM 1 G IJ SOLR
250.0000 mg | Freq: Once | INTRAMUSCULAR | Status: AC
  Administered 2016-01-14: 250 mg via INTRAMUSCULAR

## 2016-01-14 MED ORDER — FLUCONAZOLE 150 MG PO TABS: 150.0000 mg | ORAL_TABLET | Freq: Once | ORAL | Status: DC

## 2016-01-14 MED ORDER — METRONIDAZOLE 500 MG PO TABS: 500.0000 mg | ORAL_TABLET | Freq: Two times a day (BID) | ORAL | Status: DC

## 2016-01-14 MED FILL — SUBOXONE 12 MG-3 MG SL FILM: 12-3 | 28 days supply | Qty: 56 | Fill #0

## 2016-01-14 NOTE — Progress Notes (Signed)
Patient ID: Kimberly MiresBrittany G Barrera, female   DOB: 08/30/1990, 25 y.o.   MRN: [id number]    Subjective:    Patient ID: Kimberly MiresBrittany G Barrera, female    DOB: 06/04/1991, 25 y.o.   MRN: [id number]  Patient presents for Hand Numbness and Vaginal Iritation Patient here at dad tinnitus. She's had foul odor vaginal discharge for the past week. She has been sexually active with new partner. She not use any protection. She states sometimes it is a thick color other times just copious amounts of white. She denies any abnormal vaginal bleeding she does have 9.  She is also concerned with numbness of bilateral hands. This started after she began injecting for drug abuse. Initially she had numbness in her forearm but that is resolved but her hands go numb 10-15 times a day. She denies any swelling of the forearm or the hand denies any redness. No neck injury   At the end of visit she asked for prescription to treat lisinopril both of her children had it she feels itching and has seen a few bugs in her own hair  Requests refill on her Vyvanse Review Of Systems:  GEN- denies fatigue, fever, weight loss,weakness, recent illness HEENT- denies eye drainage, change in vision, nasal discharge, CVS- denies chest pain, palpitations RESP- denies SOB, cough, wheeze ABD- denies N/V, change in stools, abd pain GU- denies dysuria, hematuria, dribbling, incontinence MSK- denies joint pain, muscle aches, injury Neuro- denies headache, dizziness, syncope, seizure activity       Objective:    BP 118/62 mmHg  Pulse 68  Temp(Src) 98.9 F (37.2 C) (Oral)  Resp 16  Ht 5\' 5"  (1.651 m)  Wt 132 lb (59.875 kg)  BMI 21.97 kg/m2 GEN- NAD, alert and oriented x3 Neck- FROM GU- normal external genitalia, vaginal mucosa pink and moist, cervix visualized no growth, no blood form os, large amount of  White discharge, no CMT, no ovarian masses, uterus normal size EXT- No edema, left anticubital fossa scarring over vein   Neuro-CNII-XII intact, strength equal bilat UE, sensation grossly intact, decrease at finger tips, neg tinels Pulses- Radial  2+        Assessment & Plan:      Problem List Items Addressed This Visit    Vaginal discharge   Relevant Medications   cefTRIAXone (ROCEPHIN) injection 250 mg (Completed)   ADD (attention deficit disorder)   Relevant Medications   lisdexamfetamine (VYVANSE) 60 MG capsule    Other Visit Diagnoses    Screen for STD (sexually transmitted disease)    -  Primary    Relevant Orders    HIV antibody    RPR    HSV(herpes smplx)abs-1+2(IgG+IgM)-bld    Acute Hep Panel & Hep B Surface Ab    Lice        Sklice treatment    Neuropathy of hand, unspecified laterality        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 Any transcriptional errors that result from this process are unintentional.

## 2016-01-14 NOTE — Patient Instructions (Signed)
We will call with results Take the antibiotics Use the lice medication F/U as needed

## 2016-01-15 LAB — HIV ANTIBODY (ROUTINE TESTING W REFLEX): HIV 1&2 Ab, 4th Generation: NONREACTIVE

## 2016-01-15 LAB — RPR

## 2016-01-15 LAB — ACUTE HEP PANEL AND HEP B SURFACE AB
HCV Ab: NEGATIVE
Hep A IgM: NONREACTIVE
Hep B C IgM: NONREACTIVE
Hep B S Ab: NEGATIVE
Hepatitis B Surface Ag: NEGATIVE

## 2016-01-16 LAB — GC/CHLAMYDIA PROBE AMP
CT Probe RNA: NOT DETECTED
GC Probe RNA: NOT DETECTED

## 2016-01-17 LAB — HSV(HERPES SMPLX)ABS-I+II(IGG+IGM)-BLD
HSV 1 Glycoprotein G Ab, IgG: 24.2 Index — ABNORMAL HIGH (ref <0.90)
HSV 2 Glycoprotein G Ab, IgG: <0.9 Index (ref <0.90)
Herpes Simplex Vrs I&II-IgM Ab (EIA): 0.42 INDEX

## 2016-01-18 ENCOUNTER — Telehealth: Payer: Self-pay

## 2016-01-18 NOTE — Telephone Encounter (Signed)
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

## 2016-01-18 NOTE — Telephone Encounter (Signed)
Tried to contact patient regarding labs.  Unable to leave message. 

## 2016-02-11 ENCOUNTER — Encounter: Admit: 2016-02-11 | Payer: MEDICAID | Primary: Family

## 2016-02-11 ENCOUNTER — Encounter

## 2016-02-11 DIAGNOSIS — Z3483 Encounter for supervision of other normal pregnancy, third trimester: Secondary | ICD-10-CM

## 2016-02-16 ENCOUNTER — Telehealth: Payer: Self-pay | Admitting: Family Medicine

## 2016-02-16 MED ORDER — FLUCONAZOLE 150 MG PO TABS: 150.0000 mg | ORAL_TABLET | Freq: Once | ORAL | Status: DC

## 2016-02-16 NOTE — Telephone Encounter (Signed)
Pt states that she has a yeast infection from the abx that were given to her on her last OV. She is requesting that we [facility] [address] [facility] [address] [facility] [address]

## 2016-02-16 NOTE — Telephone Encounter (Signed)
Prescription sent to pharmacy.

## 2016-02-17 MED FILL — SUBOXONE 12 MG-3 MG SL FILM: 12-3 | 28 days supply | Qty: 56 | Fill #0

## 2016-02-18 LAB — FETAL FIBRONECTIN

## 2016-02-18 LAB — WET PREP FOR TRICH, YEAST, CLUE: Yeast Wet Prep HPF POC: NONE SEEN

## 2016-03-17 MED FILL — SUBOXONE 12 MG-3 MG SL FILM: 12-3 | 28 days supply | Qty: 56 | Fill #0

## 2016-03-22 ENCOUNTER — Ambulatory Visit (INDEPENDENT_AMBULATORY_CARE_PROVIDER_SITE_OTHER): Payer: Medicaid Other | Admitting: Family Medicine

## 2016-03-22 ENCOUNTER — Encounter: Payer: Self-pay | Admitting: Family Medicine

## 2016-03-22 VITALS — BP 122/64 | HR 72 | Temp 98.6°F | Resp 14 | Ht 65.0 in | Wt 128.0 lb

## 2016-03-22 DIAGNOSIS — N760 Acute vaginitis: Secondary | ICD-10-CM | POA: Diagnosis not present

## 2016-03-22 DIAGNOSIS — Z1159 Encounter for screening for other viral diseases: Secondary | ICD-10-CM | POA: Diagnosis not present

## 2016-03-22 DIAGNOSIS — F1910 Other psychoactive substance abuse, uncomplicated: Secondary | ICD-10-CM | POA: Diagnosis not present

## 2016-03-22 LAB — WET PREP FOR TRICH, YEAST, CLUE
Trich, Wet Prep: NONE SEEN
Yeast Wet Prep HPF POC: NONE SEEN

## 2016-03-22 MED ORDER — METRONIDAZOLE 500 MG PO TABS: 500.0000 mg | ORAL_TABLET | Freq: Two times a day (BID) | ORAL | 0 refills | Status: DC

## 2016-03-22 MED ORDER — CEFTRIAXONE SODIUM 250 MG IJ SOLR
250.0000 mg | Freq: Once | INTRAMUSCULAR | Status: AC
  Administered 2016-03-22: 250 mg via INTRAMUSCULAR

## 2016-03-22 MED ORDER — FLUCONAZOLE 150 MG PO TABS: ORAL_TABLET | ORAL | 1 refills | Status: DC

## 2016-03-22 NOTE — Assessment & Plan Note (Signed)
Very unfortunate situation and now she understands that she has lost so much from her drug addiction. She states that she has gone back to church recently and states that she will never use any more drugs. She is willing to go to rehabilitation but now she has a problem with the courts and recently been arrested therefore she needs to contact her lawyer to see where she can do all of the classes mandated by the courts. I did state that the lawyer can call me if needed especially to set things up. [facility] and she was already in a [facility]ox[facility] clinic and seeing a therapist here.

## 2016-03-22 NOTE — Patient Instructions (Signed)
We will call with lab resuls F/U as needed

## 2016-03-22 NOTE — Progress Notes (Signed)
Subjective:    Patient ID: Kimberly Barrera, female    DOB: 12/16/1990, 25 y.o.   MRN: [id number]  Patient presents for Vaginal Discharge (x1 month- light yellow diachage with foul odor)  Vaginal discharge for past month Treated for Trichomonas with flagyl in June 2017, also had positive HSV I titers  She has been sexually active with a new partner but states that she did use condoms. She is itching with her discharge.  Unfortunately she was last multiple times since her last visit. Her children's father now have temporary custody of the children she states that she tried to get child support and he became upset and then take out custody papers. Because of her drug use in her rest he has been been given custody and she can only have supervised visits. She was arrested recently and for [place] and is awaiting her trial which is 2 months away supposedly she is supposed to stay in [place] and she is on a pretrial therefore she will be losing her insurance and the children will also be losing their insurance.  She does admit to trying to commit suicide by overdosing however she states it did not work. She shot heroin up her arms as well as the side of her neck     Review Of Systems:  GEN- denies fatigue, fever, weight loss,weakness, recent illness HEENT- denies eye drainage, change in vision, nasal discharge, CVS- denies chest pain, palpitations RESP- denies SOB, cough, wheeze ABD- denies N/V, change in stools, abd pain GU- denies dysuria, hematuria, dribbling, incontinence MSK- denies joint pain, muscle aches, injury Neuro- denies headache, dizziness, syncope, seizure activity       Objective:    BP 122/64 (BP Location: Left Arm, Patient Position: Sitting, Cuff Size: Normal)   Pulse 72   Temp 98.6 F (37 C) (Oral)   Resp 14   Ht 5\' 5"  (1.651 m)   Wt 128 lb (58.1 kg)   BMI 21.30 kg/m  GEN- NAD, alert and oriented x3 HEENT- PERRL, EOMI, non injected sclera, pink conjunctiva,  MMM, oropharynx clear CVS- RRR, no murmur RESP-CTAB ABD-NABS,soft,NT,ND GU- normal external genitalia, vaginal mucosa pink and moist, cervix visualized no growth, no blood form os, + discharge, mild Left CMT, no ovarian masses, uterus normal size EXT- No edema Skin- nodule palpated over recent vein used in right forearm and left neck, NT, not pulsatile or fluctuant Pulses- Radial, DP- 2+        Assessment & Plan:      Problem List Items Addressed This Visit    Substance abuse    Very unfortunate situation and now she understands that she has lost so much from her drug addiction. She states that she has gone back to church recently and states that she will never use any more drugs. She is willing to go to rehabilitation but now she has a problem with the courts and recently been arrested therefore she needs to contact her lawyer to see where she can do all of the classes mandated by the courts. I did state that the lawyer can call me if needed especially to set things up. [facility] and she was already in a Suboxone clinic and seeing a therapist here.       Other Visit Diagnoses    Vaginitis and vulvovaginitis    -  Primary   Bacterial vaginosis noted however GC is pending. I've given her a shot of Rocephin will also check labs for STD screening she  was also given Diflucan and Flagyl My have early PID   Relevant Medications   cefTRIAXone (ROCEPHIN) injection 250 mg (Completed)   Other Relevant Orders   WET PREP FOR TRICH, YEAST, CLUE (Completed)   GC/Chlamydia Probe Amp   CBC with Differential/Platelet   Comprehensive metabolic panel   Screening for viral disease       Relevant Orders   HIV antibody   RPR   Hepatitis panel, acute   CBC with Differential/Platelet   Comprehensive metabolic panel      Note: This dictation was prepared with Dragon dictation along with smaller phrase technology. Any transcriptional errors that result from this process are unintentional.

## 2016-03-23 LAB — CBC WITH DIFFERENTIAL/PLATELET
Basophils Absolute: 97 cells/uL (ref 0–200)
Basophils Relative: 1 %
Eosinophils Absolute: 97 cells/uL (ref 15–500)
Eosinophils Relative: 1 %
HCT: 42.4 % (ref 35.0–45.0)
Hemoglobin: 14.1 g/dL (ref 12.0–15.0)
Lymphocytes Relative: 38 %
Lymphs Abs: 3686 cells/uL (ref 850–3900)
MCH: 30.1 pg (ref 27.0–33.0)
MCHC: 33.3 g/dL (ref 32.0–36.0)
MCV: 90.6 fL (ref 80.0–100.0)
MPV: 10.1 fL (ref 7.5–12.5)
Monocytes Absolute: 679 cells/uL (ref 200–950)
Monocytes Relative: 7 %
Neutro Abs: 5141 cells/uL (ref 1500–7800)
Neutrophils Relative %: 53 %
Platelets: 327 10*3/uL (ref 140–400)
RBC: 4.68 MIL/uL (ref 3.80–5.10)
RDW: 13.1 % (ref 11.0–15.0)
WBC: 9.7 10*3/uL (ref 3.8–10.8)

## 2016-03-23 LAB — GC/CHLAMYDIA PROBE AMP
CT Probe RNA: NOT DETECTED
GC Probe RNA: NOT DETECTED

## 2016-03-23 LAB — RPR

## 2016-03-23 LAB — COMPREHENSIVE METABOLIC PANEL
ALT: 16 U/L (ref 6–29)
AST: 17 U/L (ref 10–30)
Albumin: 4.5 g/dL (ref 3.6–5.1)
Alkaline Phosphatase: 92 U/L (ref 33–115)
BUN: 11 mg/dL (ref 7–25)
CO2: 25 mmol/L (ref 20–31)
Calcium: 9.9 mg/dL (ref 8.6–10.2)
Chloride: 105 mmol/L (ref 98–110)
Creat: 0.67 mg/dL (ref 0.50–1.10)
Glucose, Bld: 73 mg/dL (ref 70–99)
Potassium: 4.9 mmol/L (ref 3.5–5.3)
Sodium: 143 mmol/L (ref 135–146)
Total Bilirubin: 0.5 mg/dL (ref 0.2–1.2)
Total Protein: 7.6 g/dL (ref 6.1–8.1)

## 2016-03-23 LAB — HEPATITIS PANEL, ACUTE
HCV Ab: NEGATIVE
Hep A IgM: NONREACTIVE
Hep B C IgM: NONREACTIVE
Hepatitis B Surface Ag: NEGATIVE

## 2016-03-23 LAB — HIV ANTIBODY (ROUTINE TESTING W REFLEX): HIV 1&2 Ab, 4th Generation: NONREACTIVE

## 2016-04-13 ENCOUNTER — Ambulatory Visit: Payer: Self-pay | Admitting: Physician Assistant

## 2016-04-13 ENCOUNTER — Telehealth: Payer: Self-pay | Admitting: Family Medicine

## 2016-04-13 NOTE — Telephone Encounter (Signed)
Call placed to patient.   Advised that OV is required.   Appointment scheduled.

## 2016-04-13 NOTE — Telephone Encounter (Signed)
Ms. Kimberly Barrera called saying Dr. Jeanice Limurham is well aware of the "issues" she's had in the past and she's wondering if two Rx's can be sent to her pharmacy (an antibiotic and a pill for yeast infections). Please give her a call if needed regarding this.  Pt's ph# [phone] Thank you.

## 2016-04-14 ENCOUNTER — Telehealth: Payer: Self-pay | Admitting: Family Medicine

## 2016-04-14 DIAGNOSIS — F988 Other specified behavioral and emotional disorders with onset usually occurring in childhood and adolescence: Secondary | ICD-10-CM

## 2016-04-14 MED ORDER — LISDEXAMFETAMINE DIMESYLATE 60 MG PO CAPS: 60.0000 mg | ORAL_CAPSULE | ORAL | 0 refills | Status: DC

## 2016-04-14 MED ORDER — FLUCONAZOLE 150 MG PO TABS: ORAL_TABLET | ORAL | 1 refills | Status: DC

## 2016-04-14 MED FILL — SUBOXONE 12 MG-3 MG SL FILM: 12-3 | 28 days supply | Qty: 56 | Fill #0

## 2016-04-14 NOTE — Telephone Encounter (Signed)
Ok with vyvanse but she missed appt yesterday and wants us to call something in today.  I think that is inappropriate.

## 2016-04-14 NOTE — Telephone Encounter (Signed)
[phone] (H) Patient is calling to speak to you regarding her recurring yeast infections and also about getting a refill on her vyvanse if possible

## 2016-04-14 NOTE — Telephone Encounter (Signed)
Patient was to be seen by PA on 04/13/2016, but could not make appointment.   Reports increased itching and thick white discharge. Diflucan sent to pharmacy.   Patient also requesting refill on Vyvanse x1 month until she can see PCP.   Ok to refill??  Last office visit 03/22/2016.  Last refill  01/14/2016, X2 printed prescriptions.

## 2016-04-14 NOTE — Telephone Encounter (Signed)
Prescription printed and patient made aware to come to office to pick up after 2pm on 04/14/2016. 

## 2016-05-15 ENCOUNTER — Telehealth: Payer: Self-pay | Admitting: Family Medicine

## 2016-05-15 ENCOUNTER — Encounter: Payer: Self-pay | Admitting: Family Medicine

## 2016-05-15 ENCOUNTER — Ambulatory Visit (INDEPENDENT_AMBULATORY_CARE_PROVIDER_SITE_OTHER): Payer: Medicaid Other | Admitting: Family Medicine

## 2016-05-15 VITALS — BP 110/60 | HR 68 | Temp 98.7°F | Resp 14 | Ht 65.0 in | Wt 126.0 lb

## 2016-05-15 DIAGNOSIS — F419 Anxiety disorder, unspecified: Secondary | ICD-10-CM | POA: Diagnosis not present

## 2016-05-15 DIAGNOSIS — G629 Polyneuropathy, unspecified: Secondary | ICD-10-CM | POA: Insufficient documentation

## 2016-05-15 DIAGNOSIS — F319 Bipolar disorder, unspecified: Secondary | ICD-10-CM

## 2016-05-15 DIAGNOSIS — G589 Mononeuropathy, unspecified: Secondary | ICD-10-CM

## 2016-05-15 DIAGNOSIS — F902 Attention-deficit hyperactivity disorder, combined type: Secondary | ICD-10-CM | POA: Diagnosis not present

## 2016-05-15 MED ORDER — MELOXICAM 7.5 MG PO TABS: 7.5000 mg | ORAL_TABLET | Freq: Every day | ORAL | 1 refills | Status: DC

## 2016-05-15 MED ORDER — LISDEXAMFETAMINE DIMESYLATE 60 MG PO CAPS: 60.0000 mg | ORAL_CAPSULE | ORAL | 0 refills | Status: DC

## 2016-05-15 MED ORDER — METRONIDAZOLE 500 MG PO TABS: 500.0000 mg | ORAL_TABLET | Freq: Three times a day (TID) | ORAL | 0 refills | Status: DC

## 2016-05-15 MED ORDER — TRAZODONE HCL 50 MG PO TABS: 25.0000 mg | ORAL_TABLET | Freq: Every day | ORAL | 3 refills | Status: DC

## 2016-05-15 MED ORDER — CLINDAMYCIN PHOS-BENZOYL PEROX 1-5 % EX GEL: Freq: Two times a day (BID) | CUTANEOUS | 3 refills | Status: DC

## 2016-05-15 MED ORDER — BUPROPION HCL ER (XL) 300 MG PO TB24: ORAL_TABLET | ORAL | 3 refills | Status: DC

## 2016-05-15 NOTE — Assessment & Plan Note (Signed)
Refilled her psychiatric medications including a prescription for her Vyvanse. I do not understand why her psychiatrist is not feeling this in [place]. I'm not sure how long she'll be to follow up with our clinic and she now resides temperature and she is currently with a different insurance payor.  She is at least getting regular psychiatric care as well as therapy and she is maintaining a job and has been substance free for the past couple months.  With regards to her neuropathy she deathly needs some type of nerve conduction study performed however with her insurance problems we are unable to do this at this time. Her symptoms are mimicking more of a carpal tunnel now she is working however this started after multiple IV drug use into her arm. I've given her brace to wear and I've given her an anti-inflammatory today

## 2016-05-15 NOTE — Telephone Encounter (Signed)
Patient states that she does have discharge.   Reports that she has only been taking half of ABTx, and saving the rest if she has more discharge at later time.   Advised that this is not effectively treating bacteria, and patient should complete entire course.   Verbalized understanding.   Prescription sent to pharmacy.

## 2016-05-15 NOTE — Telephone Encounter (Signed)
She did not ask about any BV or urine during exam If she is having symptoms Okay to send in Flagyl 500mg  BID for 7 days,otherwise no meds needed

## 2016-05-15 NOTE — Telephone Encounter (Signed)
Kimberly Barrera said she had discussed with you a med to help with her bacterial vaginosis would like to know if you could call this into the pharmacy for her if possible  Please call her at [phone]  [facility]

## 2016-05-15 NOTE — Progress Notes (Signed)
Subjective:    Patient ID: Kimberly Barrera, female    DOB: 11-27-1990, 25 y.o.   MRN: [id number]  Patient presents for B Hand Numbness (reports that she has spoken to provider about hand pain/ numbness, but was not able to F/U d/t personal issues in [place]) Patient here for follow-up. She is currently residing in [place] awaiting her court date. She is an addiction counseling she's seen a psychiatrist but they have not filled her medication states she is going to the local health department for mental health. She is working full time is trying to meet the requirements that she can get custody of her children back. She continues to have problems with  both hands R >L  with the tingling and numbness which has worsened since she started her new job where there is a lot of lifting and repetitive motions with her hands. She is even had some joint pain since she started working. She was unable to get a nerve conduction study previously as she did not have the correct Medicaid card.  She is requesting a refill on her psychiatric medications.    Review Of Systems:  GEN- denies fatigue, fever, weight loss,weakness, recent illness HEENT- denies eye drainage, change in vision, nasal discharge, CVS- denies chest pain, palpitations RESP- denies SOB, cough, wheeze ABD- denies N/V, change in stools, abd pain GU- denies dysuria, hematuria, dribbling, incontinence MSK- denies joint pain, muscle aches, injury Neuro- denies headache, dizziness, syncope, seizure activity       Objective:    BP 110/60 (BP Location: Left Arm, Patient Position: Sitting, Cuff Size: Normal)   Pulse 68   Temp 98.7 F (37.1 C) (Oral)   Resp 14   Ht 5\' 5"  (1.651 m)   Wt 126 lb (57.2 kg)   BMI 20.97 kg/m  GEN- NAD, alert and oriented x3 HEENT- PERRL, EOMI, non injected sclera, pink conjunctiva, MMM, oropharynx clear CVS- RRR, no murmur RESP-CTAB EXT- No edema, left anticubital fossa scarring over vein  Neuro-CNII-XII  intact, strength equal bilat UE, sensation grossly intact, decrease at finger tips, pos phalens left side, +tinels right side Psych- normal affect and mood  EXT- No edema Pulses- Radial, 2+        Assessment & Plan:      Problem List Items Addressed This Visit    Peripheral neuropathy (HCC)   Relevant Medications   lisdexamfetamine (VYVANSE) 60 MG capsule   buPROPion (WELLBUTRIN XL) 300 MG 24 hr tablet   traZODone (DESYREL) 50 MG tablet   Bipolar 1 disorder, depressed (HCC) - Primary   Relevant Medications   buPROPion (WELLBUTRIN XL) 300 MG 24 hr tablet   traZODone (DESYREL) 50 MG tablet   Anxiety, mild   Relevant Medications   buPROPion (WELLBUTRIN XL) 300 MG 24 hr tablet   traZODone (DESYREL) 50 MG tablet   ADD (attention deficit disorder)    Refilled her psychiatric medications including a prescription for her Vyvanse. I do not understand why her psychiatrist is not feeling this in [place]. I'm not sure how long she'll be to follow up with our clinic and she now resides temperature and she is currently with a different insurance payor.  She is at least getting regular psychiatric care as well as therapy and she is maintaining a job and has been substance free for the past couple months.  With regards to her neuropathy she deathly needs some type of nerve conduction study performed however with her insurance problems we are unable to  do this at this time. Her symptoms are mimicking more of a carpal tunnel now she is working however this started after multiple IV drug use into her arm. I've given her brace to wear and I've given her an anti-inflammatory today      Relevant Medications   lisdexamfetamine (VYVANSE) 60 MG capsule    Other Visit Diagnoses   None.     Note: This dictation was prepared with Dragon dictation along with smaller phrase technology. Any transcriptional errors that result from this process are unintentional.

## 2016-05-15 NOTE — Telephone Encounter (Signed)
MD please advise.   Of note, patient did leave urine sample.

## 2016-05-15 NOTE — Patient Instructions (Signed)
Use wrist brace as prescribed Meloxicam anti-inflammatory  Continue all other meds F/U as needed 0.

## 2016-05-16 MED FILL — SUBOXONE 12 MG-3 MG SL FILM: 12-3 | 28 days supply | Qty: 56 | Fill #0

## 2016-06-23 ENCOUNTER — Other Ambulatory Visit: Payer: Self-pay | Admitting: *Deleted

## 2016-06-23 DIAGNOSIS — F902 Attention-deficit hyperactivity disorder, combined type: Secondary | ICD-10-CM

## 2016-06-23 MED ORDER — LISDEXAMFETAMINE DIMESYLATE 60 MG PO CAPS: 60.0000 mg | ORAL_CAPSULE | ORAL | 0 refills | Status: DC

## 2016-06-23 NOTE — Telephone Encounter (Signed)
Patient seen in office with son.   Requested refill on Vyvanse.   MD made aware and approved.   Prescription printed and given to patient.

## 2016-06-27 MED FILL — SUBOXONE 12 MG-3 MG SL FILM: 12-3 | 28 days supply | Qty: 56 | Fill #0

## 2016-12-07 ENCOUNTER — Encounter: Payer: Self-pay | Admitting: Family Medicine

## 2017-01-11 IMAGING — US US OB COMP LESS 14 WK
1 series · 14 of 28 positions shown · non-contrast
Comparison: None.

CLINICAL DATA: Positive pregnancy test

EXAM:
OBSTETRIC <14 WK US AND TRANSVAGINAL OB US
TECHNIQUE: Both transabdominal and transvaginal ultrasound examinations were
performed for complete evaluation of the gestation as well as the
maternal uterus, adnexal regions, and pelvic cul-de-sac.
Transvaginal technique was performed to assess early pregnancy.

[Series 1: us ob comp less 14 wk · 0.22mm/px · 14 of 86 slices shown]
[im 4/86]
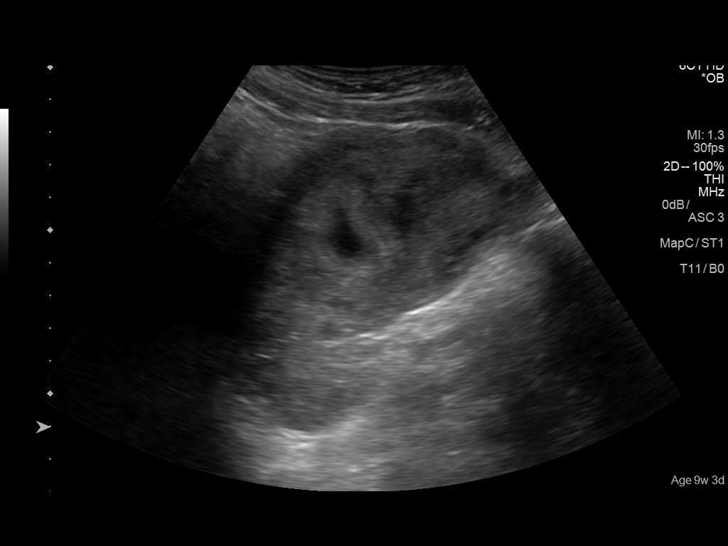
[im 10/86]
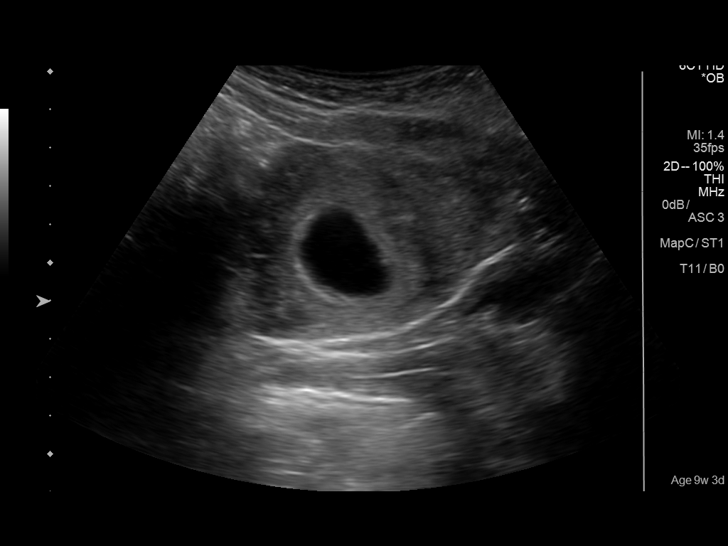
[im 16/86]
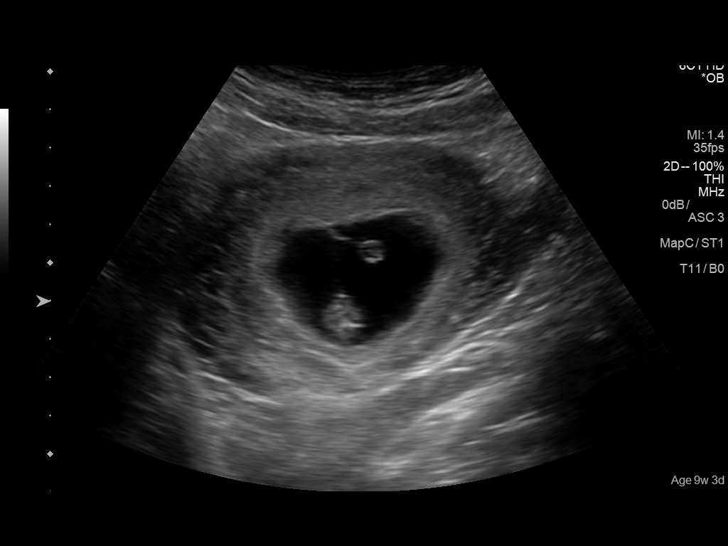
[im 23/86]
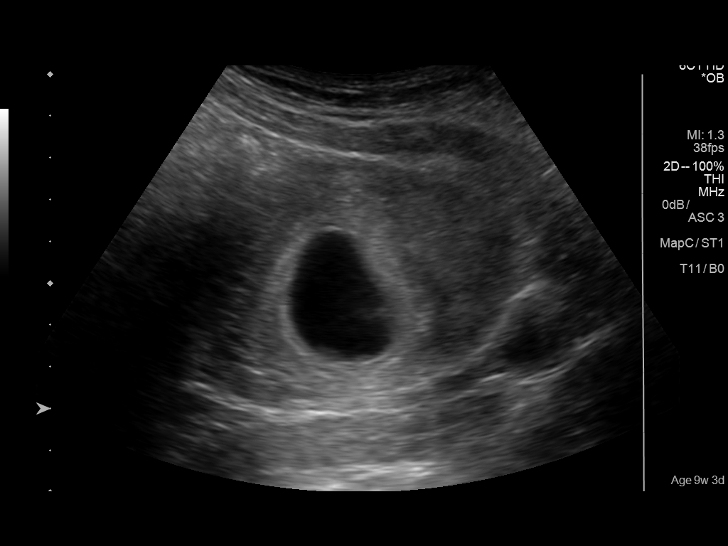
[im 29/86]
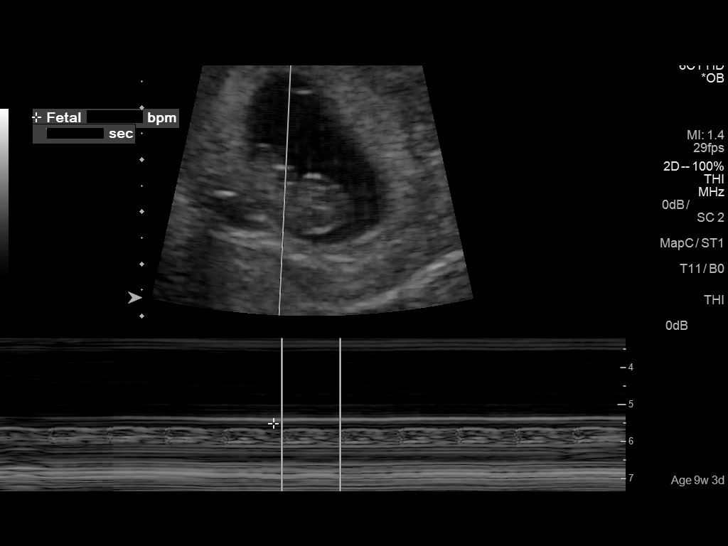
[im 35/86]
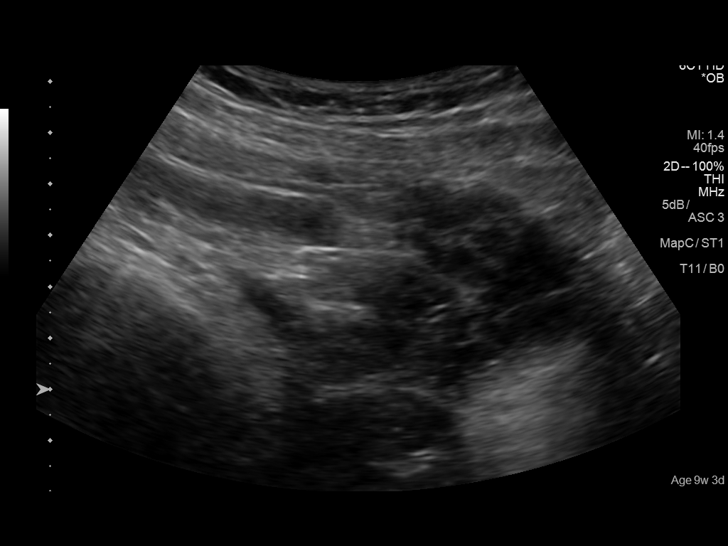
[im 41/86]
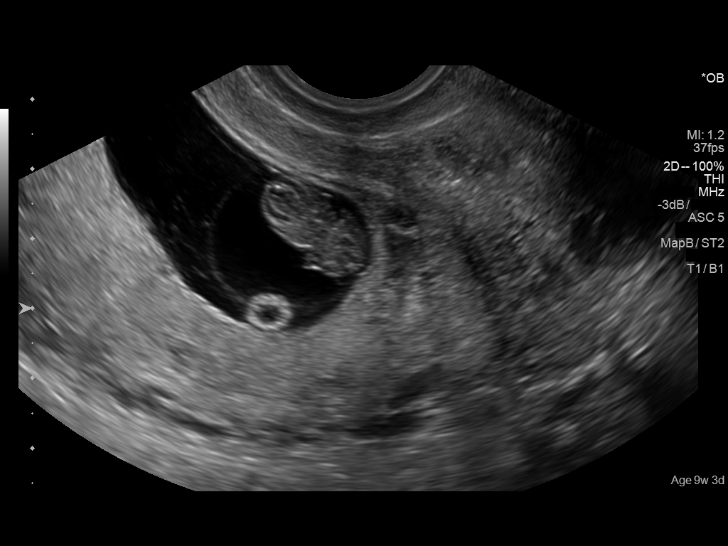
[im 48/86]
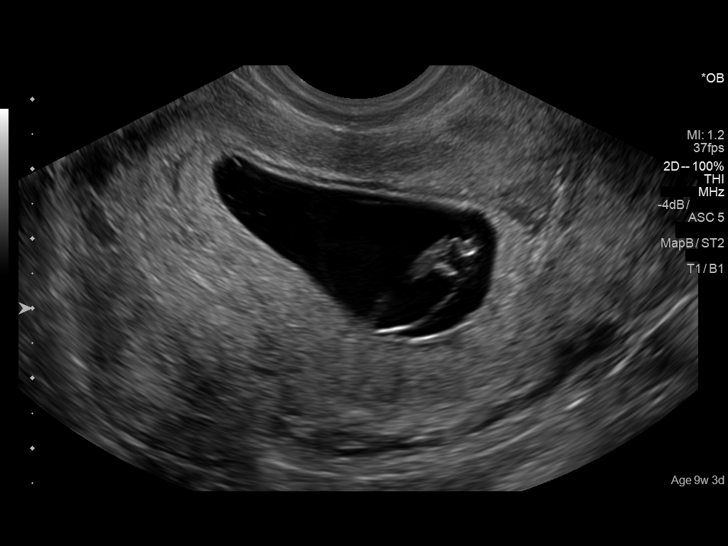
[im 54/86]
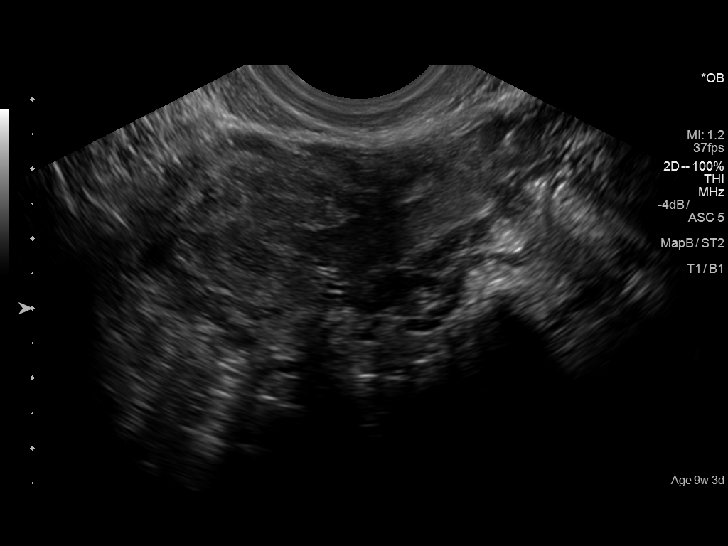
[im 60/86]
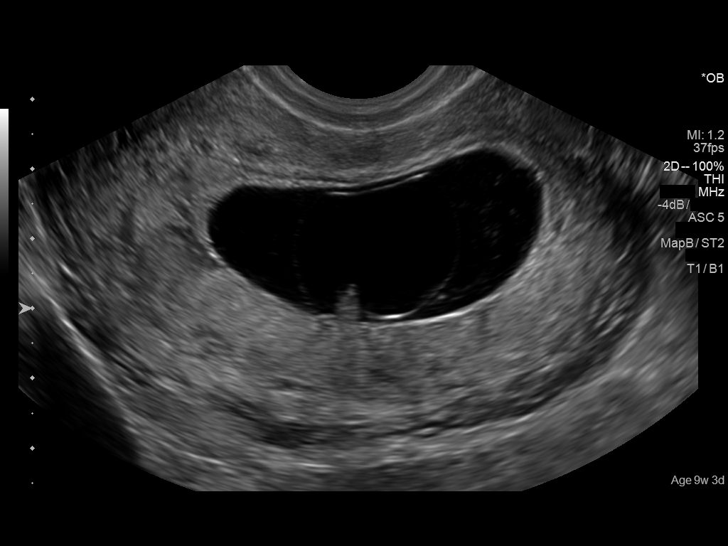
[im 67/86]
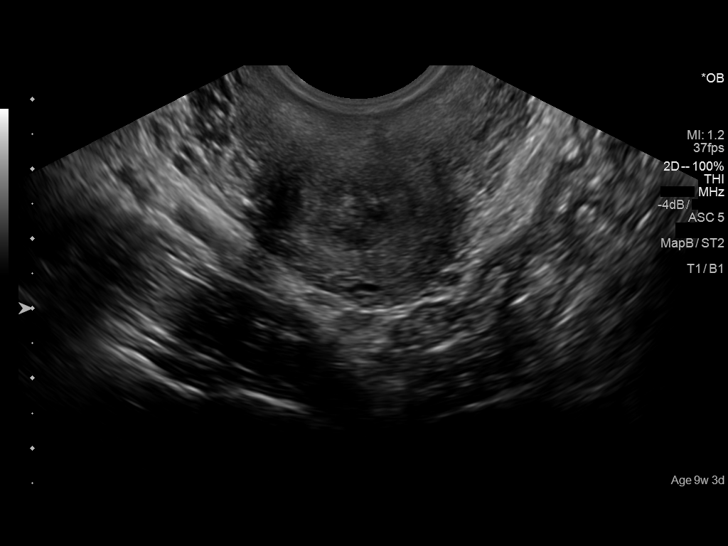
[im 73/86]
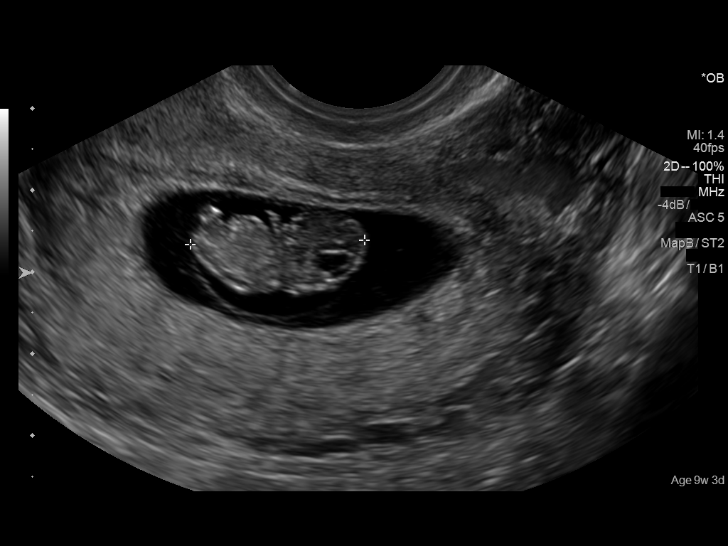
[im 79/86]
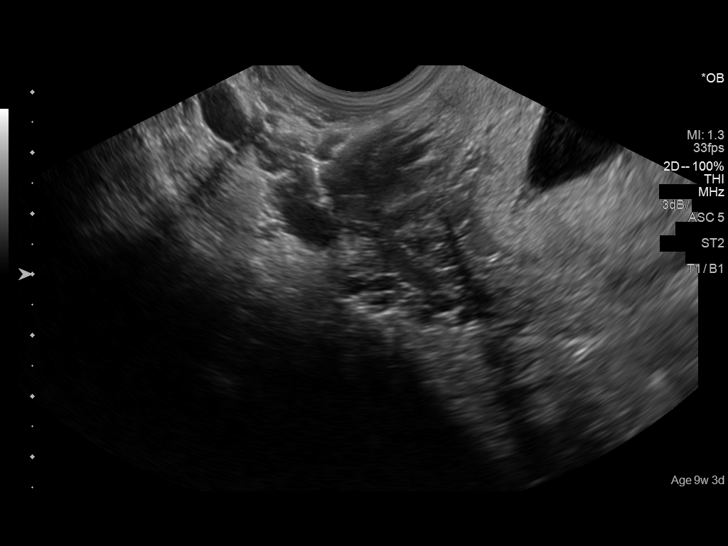
[im 86/86]
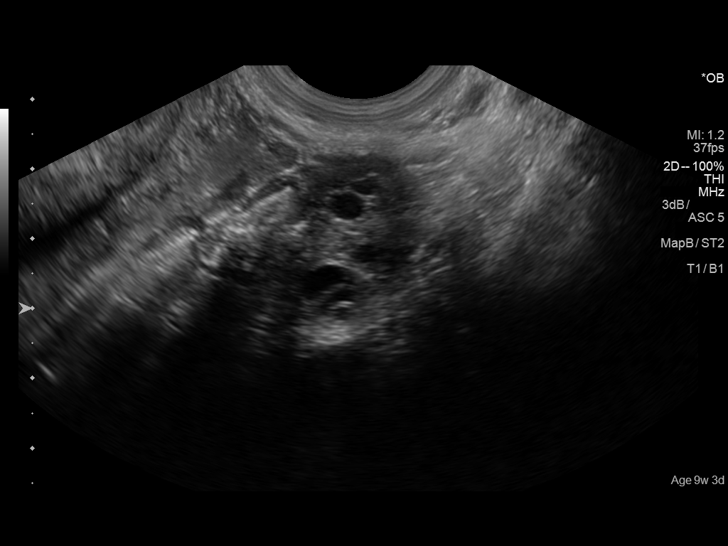

[14 of 28 positions shown; findings below may reference images not displayed]

FINDINGS: Intrauterine gestational sac: Visualized/normal in shape.

Yolk sac:  visualized

Embryo:  Visualized

Cardiac Activity: Visualized

Heart Rate: 178  bpm

MSD:   mm    w     d

CRL:  21.3  mm   8 w   5 d                  US EDC: 04/14/2016

Subchorionic hemorrhage:  Probable small subchorionic hemorrhage.

Maternal uterus/adnexae: No adnexal masses or free fluid.
IMPRESSION: Eight week 5 day intrauterine pregnancy. Fetal heart rate 178 beats
per minute. Probable small subchorionic hemorrhage.

## 2017-01-27 ENCOUNTER — Emergency Department (HOSPITAL_COMMUNITY): Payer: Medicaid - Out of State

## 2017-01-27 ENCOUNTER — Emergency Department (HOSPITAL_COMMUNITY)
Admission: EM | Admit: 2017-01-27 | Discharge: 2017-01-27 | Disposition: A | Discharge status: Home / Self Care | Payer: Medicaid - Out of State | Attending: Emergency Medicine | Admitting: Emergency Medicine

## 2017-01-27 ENCOUNTER — Encounter (HOSPITAL_COMMUNITY): Payer: Self-pay

## 2017-01-27 DIAGNOSIS — R4020 Unspecified coma: Secondary | ICD-10-CM | POA: Diagnosis present

## 2017-01-27 DIAGNOSIS — Z79899 Other long term (current) drug therapy: Secondary | ICD-10-CM | POA: Diagnosis not present

## 2017-01-27 DIAGNOSIS — F17210 Nicotine dependence, cigarettes, uncomplicated: Secondary | ICD-10-CM | POA: Insufficient documentation

## 2017-01-27 DIAGNOSIS — T401X1A Poisoning by heroin, accidental (unintentional), initial encounter: Secondary | ICD-10-CM | POA: Insufficient documentation

## 2017-01-27 DIAGNOSIS — Y939 Activity, unspecified: Secondary | ICD-10-CM | POA: Diagnosis not present

## 2017-01-27 DIAGNOSIS — Y929 Unspecified place or not applicable: Secondary | ICD-10-CM | POA: Diagnosis not present

## 2017-01-27 DIAGNOSIS — Y999 Unspecified external cause status: Secondary | ICD-10-CM | POA: Insufficient documentation

## 2017-01-27 LAB — COMPREHENSIVE METABOLIC PANEL
ALT: 98 U/L — ABNORMAL HIGH (ref 14–54)
AST: 78 U/L — ABNORMAL HIGH (ref 15–41)
Albumin: 4.1 g/dL (ref 3.5–5.0)
Alkaline Phosphatase: 81 U/L (ref 38–126)
Anion gap: 8 (ref 5–15)
BUN: 7 mg/dL (ref 6–20)
CO2: 23 mmol/L (ref 22–32)
Calcium: 8.9 mg/dL (ref 8.9–10.3)
Chloride: 104 mmol/L (ref 101–111)
Creatinine, Ser: 0.73 mg/dL (ref 0.44–1.00)
GFR calc Af Amer: >60 mL/min (ref >60)
GFR calc non Af Amer: >60 mL/min (ref >60)
Glucose, Bld: 93 mg/dL (ref 65–99)
Potassium: 3.9 mmol/L (ref 3.5–5.1)
Sodium: 135 mmol/L (ref 135–145)
Total Bilirubin: 0.8 mg/dL (ref 0.3–1.2)
Total Protein: 7.8 g/dL (ref 6.5–8.1)

## 2017-01-27 LAB — CBC
HCT: 38.7 % (ref 36.0–46.0)
Hemoglobin: 13.6 g/dL (ref 12.0–15.0)
MCH: 31.2 pg (ref 26.0–34.0)
MCHC: 35.1 g/dL (ref 30.0–36.0)
MCV: 88.8 fL (ref 78.0–100.0)
Platelets: 342 10*3/uL (ref 150–400)
RBC: 4.36 MIL/uL (ref 3.87–5.11)
RDW: 13.6 % (ref 11.5–15.5)
WBC: 25 10*3/uL — ABNORMAL HIGH (ref 4.0–10.5)

## 2017-01-27 LAB — I-STAT BETA HCG BLOOD, ED (MC, WL, AP ONLY): I-stat hCG, quantitative: <5 m[IU]/mL (ref <5)

## 2017-01-27 LAB — SALICYLATE LEVEL: Salicylate Lvl: <7 mg/dL (ref 2.8–30.0)

## 2017-01-27 LAB — CBG MONITORING, ED: Glucose-Capillary: 94 mg/dL (ref 65–99)

## 2017-01-27 LAB — ACETAMINOPHEN LEVEL: Acetaminophen (Tylenol), Serum: <10 ug/mL — ABNORMAL LOW (ref 10–30)

## 2017-01-27 LAB — ETHANOL: Alcohol, Ethyl (B): <5 mg/dL (ref <5)

## 2017-01-27 MED ORDER — NALOXONE HCL 4 MG/0.1ML NA LIQD: NASAL | 0 refills | Status: DC

## 2017-01-27 MED ORDER — NALOXONE HCL 0.4 MG/ML IJ SOLN
0.1000 mg | Freq: Once | INTRAMUSCULAR | Status: AC
Start: 2017-01-27 — End: 2017-01-27
  Administered 2017-01-27: 0.1 mg via INTRAVENOUS
  Filled 2017-01-27: qty 1

## 2017-01-27 NOTE — ED Triage Notes (Signed)
BIB EMS from Home, pt was found unresponsive by EMS and was given 2mg  of Narcan Intranasally. Pt became alert and combative following narcan.   Pt is alert, tearful c/o cp and generalized pain.  EMS Vitals BP 136/70 RR 18 SPO2 98%

## 2017-01-27 NOTE — ED Notes (Signed)
Bed: ZO10WA25 Expected date:  Expected time:  Means of arrival:  Comments: EMS 26 yo female found unresponsive in car-boyfriend started CPR-CPR x 5 minutes-heroin overdose/known heroin user

## 2017-01-27 NOTE — ED Provider Notes (Signed)
[facility]-EMERGENCY DEPT Provider Note   CSN: [id number] Arrival date & time: 01/27/17  7829  By signing my name below, I, Kimberly Barrera, attest that this documentation has been prepared under the direction and in the presence of Melene Plan, DO. Electronically Signed: Karren Cobble, ED Scribe. 01/27/17. 4:38 AM.  History   Chief Complaint Chief Complaint  Patient presents with  . Drug Overdose  LEVEL V CAVEAT: HPI and ROS limited due to altered mental status.  The history is provided by a significant other and the EMS personnel. No language interpreter was used.    HPI HPI Comments: Kimberly Barrera is a 26 y.o. female with a history of ADHD, Bipolar 1 DO, and substance abuse, brought in by ambulance, who presents to the Emergency Department for an overdose. PER pt's boyfriend, he was unable to get in contact with the pt when he drove around to fine. She was found in her car unresponsive. He reports removing her from the vehicle and beginning CPR. He is unsure what she overdosed on but suspects it was Heroin or Cocaine. EMS was called and pt was given 2 mg of Narcan, and pt became alert and combative. At this time she responsive to painful stimuli.   Past Medical History:  Diagnosis Date  . Abortion   . ADHD (attention deficit hyperactivity disorder)   . Anxiety   . Bipolar 1 disorder (HCC)   . Contraceptive education 10/11/2015  . Depression   . Hematuria 10/11/2015  . Pain with urination 10/11/2015  . Substance abuse   . Vaginal discharge 10/11/2015   Patient Active Problem List   Diagnosis Date Noted  . Peripheral neuropathy 05/15/2016  . Pain with urination 10/11/2015  . Hematuria 10/11/2015  . Contraceptive education 10/11/2015  . Substance abuse 05/24/2015  . History of migraine with aura 04/26/2015  . History of substance abuse 04/04/2015  . Chronic back pain 04/04/2015  . Contraception management 04/04/2015  . Bipolar 1 disorder, depressed (HCC) 04/02/2015  . ADD  (attention deficit disorder) 04/02/2015  . Chronic insomnia 04/02/2015  . Anxiety, mild 03/06/2012   Past Surgical History:  Procedure Laterality Date  . INDUCED ABORTION    . WISDOM TOOTH EXTRACTION      OB History    Gravida Para Term Preterm AB Living   3 2 2  0 1 2   SAB TAB Ectopic Multiple Live Births   1 0 0 0 2     Home Medications    Prior to Admission medications   Medication Sig Start Date End Date Taking? Authorizing Provider  etonogestrel (NEXPLANON) 68 MG IMPL implant 1 each (68 mg total) by Subdermal route once. 11/23/15  Yes [place]acl[place], Velna Hatchet, MD  buPROPion (WELLBUTRIN XL) 300 MG 24 hr tablet Take 1 tablet daily Patient not taking: Reported on 01/27/2017 05/15/16   Salley Scarlet, MD  clindamycin-benzoyl peroxide [facility]) gel Apply topically 2 (two) times daily. Patient not taking: Reported on 01/27/2017 05/15/16   Salley Scarlet, MD  lisdexamfetamine (VYVANSE) 60 MG capsule Take 1 capsule (60 mg total) by mouth every morning. Patient not taking: Reported on 01/27/2017 06/23/16   Salley Scarlet, MD  meloxicam (MOBIC) 7.5 MG tablet Take 1 tablet (7.5 mg total) by mouth daily. Patient not taking: Reported on 01/27/2017 05/15/16   Salley Scarlet, MD  metroNIDAZOLE (FLAGYL) 500 MG tablet Take 1 tablet (500 mg total) by mouth 3 (three) times daily. Patient not taking: Reported on 01/27/2017 05/15/16   [facility],  Velna Hatchet, MD  naloxone [facility]) nasal spray 4 mg/0.1 mL Use for overdose 01/27/17   Melene Plan, DO  traZODone (DESYREL) 50 MG tablet Take 0.5-1 tablets (25-50 mg total) by mouth at bedtime. For sleep Patient not taking: Reported on 01/27/2017 05/15/16   Salley Scarlet, MD   Family History Family History  Problem Relation Age of Onset  . Hypertension Father   . COPD Maternal Grandmother   . Asthma Maternal Grandmother    Social History Social History  Substance Use Topics  . Smoking status: Current Every Day Smoker    Packs/day: 1.00    Years: 8.00      Types: Cigarettes    Last attempt to quit: 04/20/2011  . Smokeless tobacco: Never Used  . Alcohol use No   Allergies   Patient has no known allergies.  Review of Systems Review of Systems  Unable to perform ROS: Mental status change   Physical Exam Updated Vital Signs BP 117/64 (BP Location: Left Arm)   Pulse 87   Temp 100.3 F (37.9 C) (Oral)   Resp (!) 29   SpO2 96%   Physical Exam  Constitutional: She appears well-developed and well-nourished. No distress.  Really sleepy.   HENT:  Head: Normocephalic and atraumatic.  Eyes: EOM are normal. Pupils are equal, round, and reactive to light.  2 mm pupils.   Neck: Normal range of motion. Neck supple.  Cardiovascular: Normal rate and regular rhythm.  Exam reveals no gallop and no friction rub.   No murmur heard. Pulmonary/Chest: Effort normal. She has no wheezes. She has no rales. She exhibits tenderness.  Pain to the anterior chest pain.   Abdominal: Soft. She exhibits no distension. There is no tenderness.  Musculoskeletal: She exhibits no edema or tenderness.  Neurological:  Awakes to movement of head and eyelids.   Skin: Skin is warm and dry. She is not diaphoretic.  Psychiatric: She has a normal mood and affect.  Multiple bruises bilaterally to the arms.   Nursing note and vitals reviewed.  ED Treatments / Results  DIAGNOSTIC STUDIES: Oxygen Saturation is 98% on RA, normal by my interpretation.   COORDINATION OF CARE: 4:13 AM-Discussed next steps with pt. Pt verbalized understanding and is agreeable with the plan.   Labs (all labs ordered are listed, but only abnormal results are displayed) Labs Reviewed  COMPREHENSIVE METABOLIC PANEL - Abnormal; Notable for the following:       Result Value   AST 78 (*)    ALT 98 (*)    All other components within normal limits  ACETAMINOPHEN LEVEL - Abnormal; Notable for the following:    Acetaminophen (Tylenol), Serum <10 (*)    All other components within normal  limits  CBC - Abnormal; Notable for the following:    WBC 25.0 (*)    All other components within normal limits  ETHANOL  SALICYLATE LEVEL  RAPID URINE DRUG SCREEN, HOSP PERFORMED  CBG MONITORING, ED  I-STAT BETA HCG BLOOD, ED ([facility], [facility], [facility] ONLY)    EKG  EKG Interpretation  Date/Time:  Saturday January 27 2017 03:39:25 EDT Ventricular Rate:  111 PR Interval:    QRS Duration: 89 QT Interval:  306 QTC Calculation: 416 R Axis:   89 Text Interpretation:  Sinus tachycardia Right atrial enlargement No old tracing to compare Confirmed by Melene Plan [phone]) on 01/27/2017 5:58:18 AM       Radiology Dg Chest 2 View  Result Date: 01/27/2017 CLINICAL DATA:  Found unresponsive.  Status post CPR. Initial encounter. EXAM: CHEST  2 VIEW COMPARISON:  None. FINDINGS: The lungs are well-aerated. Mild vascular congestion is likely transient in nature. Minimal left midlung opacity may reflect transient interstitial edema. There is no evidence of pleural effusion or pneumothorax. The heart is normal in size; the mediastinal contour is within normal limits. No acute osseous abnormalities are seen. IMPRESSION: Mild vascular congestion is likely transient in nature. Minimal left midlung opacity may reflect transient interstitial edema. Electronically Signed   By: Roanna RaiderJeffery  Chang M.D.   On: 01/27/2017 04:58    Procedures Procedures (including critical care time)  Medications Ordered in ED Medications  naloxone [facility](NARCAN) injection 0.1 mg (0.1 mg Intravenous Given 01/27/17 0428)     Initial Impression / Assessment and Plan / ED Course  I have reviewed the triage vital signs and the nursing notes.  Pertinent labs & imaging results that were available during my care of the patient were reviewed by me and considered in my medical decision making (see chart for details).     26 yo F with a chief complaint of a heroin overdose. Patient was found unresponsive and CPR was started. Labs are reassuring. Patient is  not hypoxic. Unable to wake up the patient and she was able to scream for quite some time. Do not feel that she has a medical rib fracture. We'll have her follow-up with her ECP.  Final Clinical Impressions(s) / ED Diagnoses   Final diagnoses:  Accidental overdose of heroin, initial encounter    New Prescriptions Discharge Medication List as of 01/27/2017  6:29 AM    START taking these medications   Details  naloxone (NARCAN) nasal spray 4 mg/0.1 mL Use for overdose, Print      I personally performed the services described in this documentation, which was scribed in my presence. The recorded information has been reviewed and is accurate.      Melene PlanFloyd, Suheily Birks, DO 01/27/17 [phone]

## 2017-01-30 ENCOUNTER — Emergency Department (HOSPITAL_COMMUNITY)
Admission: EM | Admit: 2017-01-30 | Discharge: 2017-01-31 | Disposition: A | Discharge status: Home / Self Care | Payer: Medicaid - Out of State | Attending: Emergency Medicine | Admitting: Emergency Medicine

## 2017-01-30 ENCOUNTER — Encounter (HOSPITAL_COMMUNITY): Payer: Self-pay

## 2017-01-30 DIAGNOSIS — F558 Abuse of other non-psychoactive substances: Secondary | ICD-10-CM | POA: Diagnosis not present

## 2017-01-30 DIAGNOSIS — F17210 Nicotine dependence, cigarettes, uncomplicated: Secondary | ICD-10-CM | POA: Diagnosis not present

## 2017-01-30 DIAGNOSIS — T50901A Poisoning by unspecified drugs, medicaments and biological substances, accidental (unintentional), initial encounter: Secondary | ICD-10-CM | POA: Insufficient documentation

## 2017-01-30 LAB — I-STAT CHEM 8, ED
BUN: 5 mg/dL — ABNORMAL LOW (ref 6–20)
Calcium, Ion: 1.08 mmol/L — ABNORMAL LOW (ref 1.15–1.40)
Chloride: 100 mmol/L — ABNORMAL LOW (ref 101–111)
Creatinine, Ser: 0.8 mg/dL (ref 0.44–1.00)
Glucose, Bld: 94 mg/dL (ref 65–99)
HCT: 37 % (ref 36.0–46.0)
Hemoglobin: 12.6 g/dL (ref 12.0–15.0)
Potassium: 3.5 mmol/L (ref 3.5–5.1)
Sodium: 139 mmol/L (ref 135–145)
TCO2: 25 mmol/L (ref 0–100)

## 2017-01-30 LAB — I-STAT TROPONIN, ED: Troponin i, poc: 0.01 ng/mL (ref 0.00–0.08)

## 2017-01-30 LAB — I-STAT BETA HCG BLOOD, ED (MC, WL, AP ONLY): I-stat hCG, quantitative: <5 m[IU]/mL (ref <5)

## 2017-01-30 MED ORDER — LORAZEPAM 2 MG/ML IJ SOLN
1.0000 mg | Freq: Once | INTRAMUSCULAR | Status: AC
  Administered 2017-01-30: 1 mg via INTRAVENOUS
  Filled 2017-01-30: qty 1

## 2017-01-30 MED ORDER — ZIPRASIDONE MESYLATE 20 MG IM SOLR
20.0000 mg | Freq: Once | INTRAMUSCULAR | Status: AC
  Administered 2017-01-30: 20 mg via INTRAMUSCULAR
  Filled 2017-01-30: qty 20

## 2017-01-30 NOTE — ED Notes (Signed)
Fall risk bracelet and yellow socks placed on patient.

## 2017-01-30 NOTE — ED Notes (Signed)
Pt states she is having chest pain due to the CPR she received after her last overdose the other day.

## 2017-01-30 NOTE — ED Notes (Signed)
Unable to obtain ECG at this time as patient movement does not allow for adequate tracing.
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
## 2017-01-30 NOTE — ED Notes (Signed)
Patient out of bed, requesting to leave. Patient placed back in bed, told patient that her care is delayed due to her inability to stay still enough for ecg and xray. Patient become very agitated and stated that "Everyone here are just bitches and have shitty attitudes, I swear to god I am going to smack everyone of you bitches y'all do not understand I do drugs". Patient refuses to wear any form of monitoring as she states we are "tying her down". RN made aware of patients current state.

## 2017-01-30 NOTE — ED Notes (Signed)
Patient on phone with friend arranging her pick up as she wishes to "just leave".

## 2017-01-30 NOTE — ED Notes (Signed)
Security, [employer] and Dr. Fayrene FearingJames at bedside

## 2017-01-30 NOTE — ED Notes (Signed)
Pt requesting we call her mom Nash DimmerKerry [phone]
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
## 2017-01-31 ENCOUNTER — Telehealth: Payer: Self-pay | Admitting: Family Medicine

## 2017-01-31 LAB — I-STAT TROPONIN, ED
Troponin i, poc: 0.16 ng/mL (ref 0.00–0.08)
Troponin i, poc: 0.01 ng/mL (ref 0.00–0.08)

## 2017-01-31 LAB — SALICYLATE LEVEL: Salicylate Lvl: <7 mg/dL (ref 2.8–30.0)

## 2017-01-31 LAB — ACETAMINOPHEN LEVEL: Acetaminophen (Tylenol), Serum: <10 ug/mL — ABNORMAL LOW (ref 10–30)

## 2017-01-31 MED ORDER — ACETAMINOPHEN 325 MG PO TABS
650.0000 mg | ORAL_TABLET | Freq: Once | ORAL | Status: AC
  Administered 2017-01-31: 650 mg via ORAL
  Filled 2017-01-31: qty 2

## 2017-01-31 MED ORDER — NALOXONE HCL 4 MG/0.1ML NA LIQD
1.0000 | NASAL | Status: DC | PRN
  Administered 2017-01-31: 1 via NASAL
  Filled 2017-01-31: qty 4

## 2017-01-31 NOTE — ED Notes (Signed)
Still sleeping.

## 2017-01-31 NOTE — ED Notes (Addendum)
Family asking about the pt upfront  Pt sleeping

## 2017-01-31 NOTE — ED Provider Notes (Signed)
Patient seen and evaluated. Remains confused and agitated. Admits to cocaine over the last few days. Here 2-3 days ago with a heroin overdose. Also he required restraints and chemical sedation she was agitated tenting to leave and was felt to be a risk to herself medically if she left before metabolizing her medications to the point of being stable on her feet.     Rolland PorterJames, Yaretzy Olazabal, MD 01/31/17 Jacinta [id number]

## 2017-01-31 NOTE — ED Provider Notes (Addendum)
  Physical Exam  BP 105/71   Pulse 70   Temp 98.1 F (36.7 C) (Tympanic)   Resp 12   SpO2 96%   Physical Exam  ED Course  Procedures  MDM Patient now awake and appropriate. Complaining of chest pain. Likely secondary to CPR. Troponin has normalized.  Will discharge home with resources. Will give Narcan for home for Risk reduction.       Benjiman CorePickering, Kristie Bracewell, MD 01/31/17 [id number]    Benjiman CorePickering, Kayleen Alig, MD 01/31/17 1001

## 2017-01-31 NOTE — Telephone Encounter (Signed)
LVM for pt to return call Would like to see her in office if she is willing to discuss inpatient drug rehab She has had 2 overdose in the past 4 days

## 2017-01-31 NOTE — ED Provider Notes (Signed)
Patient signed out pending metabolization and recheck. There is some lab work pending.  Lab work notable for mildly elevated troponin at 0.16. This is in the setting of cocaine use. Reportedly, patient did endorse chest pain upon arrival. She's currently somnolent and minimally arousable. She cannot endorse further symptoms. She is 24 hours old and otherwise low risk. Will repeat troponin at 0 600 to assess for trend.  6:32 AM Nursing collecting repeat troponin. Patient remained somnolent and minimally arousable.  Repeat troponin is negative. Doubt ACS. Elevated troponin likely related to cocaine use. Given her age and lack of risk factors, doubt coronary artery disease. We'll continue to allow patient to metabolize. Patient signed out to oncoming provider, Pickering.   Shon BatonHorton, Courtney F, MD 01/31/17 [phone]

## 2017-01-31 NOTE — ED Notes (Signed)
Pt sleeping nsr on monitor  sats good

## 2017-02-20 ENCOUNTER — Encounter: Payer: Self-pay | Admitting: Family Medicine

## 2017-04-11 ENCOUNTER — Encounter: Payer: Self-pay | Admitting: Family Medicine
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
## 2017-05-07 ENCOUNTER — Encounter: Attending: Family Medicine | Primary: Family

## 2017-06-20 ENCOUNTER — Encounter (HOSPITAL_COMMUNITY): Payer: Self-pay | Admitting: Emergency Medicine

## 2017-06-20 ENCOUNTER — Ambulatory Visit (HOSPITAL_COMMUNITY)
Admission: EM | Admit: 2017-06-20 | Discharge: 2017-06-20 | Disposition: A | Discharge status: Home / Self Care | Payer: Medicaid - Out of State | Attending: Family Medicine | Admitting: Family Medicine

## 2017-06-20 DIAGNOSIS — I10 Essential (primary) hypertension: Secondary | ICD-10-CM | POA: Insufficient documentation

## 2017-06-20 DIAGNOSIS — Z8249 Family history of ischemic heart disease and other diseases of the circulatory system: Secondary | ICD-10-CM | POA: Diagnosis not present

## 2017-06-20 DIAGNOSIS — N898 Other specified noninflammatory disorders of vagina: Secondary | ICD-10-CM

## 2017-06-20 DIAGNOSIS — R102 Pelvic and perineal pain: Secondary | ICD-10-CM

## 2017-06-20 DIAGNOSIS — R110 Nausea: Secondary | ICD-10-CM | POA: Diagnosis not present

## 2017-06-20 DIAGNOSIS — N739 Female pelvic inflammatory disease, unspecified: Secondary | ICD-10-CM | POA: Diagnosis not present

## 2017-06-20 DIAGNOSIS — Z113 Encounter for screening for infections with a predominantly sexual mode of transmission: Secondary | ICD-10-CM

## 2017-06-20 DIAGNOSIS — F17210 Nicotine dependence, cigarettes, uncomplicated: Secondary | ICD-10-CM | POA: Diagnosis not present

## 2017-06-20 DIAGNOSIS — Z711 Person with feared health complaint in whom no diagnosis is made: Secondary | ICD-10-CM

## 2017-06-20 MED ORDER — DOXYCYCLINE HYCLATE 100 MG PO CAPS: 100.0000 mg | ORAL_CAPSULE | Freq: Two times a day (BID) | ORAL | 0 refills | Status: AC

## 2017-06-20 MED ORDER — CEFTRIAXONE SODIUM 250 MG IJ SOLR
250.0000 mg | Freq: Once | INTRAMUSCULAR | Status: AC
  Administered 2017-06-20: 250 mg via INTRAMUSCULAR

## 2017-06-20 MED ORDER — METRONIDAZOLE 500 MG PO TABS: 500.0000 mg | ORAL_TABLET | Freq: Two times a day (BID) | ORAL | 0 refills | Status: AC

## 2017-06-20 MED ORDER — AZITHROMYCIN 250 MG PO TABS
1000.0000 mg | ORAL_TABLET | Freq: Once | ORAL | Status: AC
  Administered 2017-06-20: 1000 mg via ORAL

## 2017-06-20 MED ORDER — CEFTRIAXONE SODIUM 250 MG IJ SOLR
INTRAMUSCULAR | Status: AC
  Filled 2017-06-20: qty 250

## 2017-06-20 MED ORDER — AZITHROMYCIN 250 MG PO TABS
ORAL_TABLET | ORAL | Status: AC
  Filled 2017-06-20: qty 4

## 2017-06-20 MED ORDER — LIDOCAINE HCL (PF) 1 % IJ SOLN
INTRAMUSCULAR | Status: AC
  Filled 2017-06-20: qty 2
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
## 2017-06-21 LAB — URINE CYTOLOGY ANCILLARY ONLY
Chlamydia: NEGATIVE
Neisseria Gonorrhea: NEGATIVE
Trichomonas: POSITIVE — AB

## 2017-06-25 LAB — URINE CYTOLOGY ANCILLARY ONLY
Bacterial vaginitis: POSITIVE — AB
Candida vaginitis: NEGATIVE

## 2017-08-14 NOTE — L&D Delivery Note (Signed)
Delivery Note Pt progressed to complete and pushed well.  At 3:46 AM a viable female was delivered via  (Presentation: vtx; ROA ).  APGAR: pending, weight  pending.   Placenta status: spontaneous, intact.  Cord:  with the following complications: tight nuchal x 1, clamped and cut on perineum.   Anesthesia:  Epidural Episiotomy:  None Lacerations:  Labial abrasions, hemostatic Suture Repair: none Est. Blood Loss (mL):  200  Mom to postpartum.  Baby to Couplet care / Skin to Skin.  She desires BTL, has signed Medicaid consent, will schedule later this am.  Discussed procedure, permanency and failure rate.  Kimberly Barrera 08/03/2018, 4:03 AM

## 2017-08-15 ENCOUNTER — Encounter (HOSPITAL_COMMUNITY): Payer: Self-pay | Admitting: Emergency Medicine

## 2017-08-15 ENCOUNTER — Emergency Department (HOSPITAL_COMMUNITY): Payer: Medicaid - Out of State

## 2017-08-15 ENCOUNTER — Emergency Department (HOSPITAL_COMMUNITY)
Admission: EM | Admit: 2017-08-15 | Discharge: 2017-08-16 | Disposition: A | Discharge status: Home / Self Care | Payer: Medicaid - Out of State | Attending: Emergency Medicine | Admitting: Emergency Medicine

## 2017-08-15 DIAGNOSIS — R509 Fever, unspecified: Secondary | ICD-10-CM | POA: Insufficient documentation

## 2017-08-15 DIAGNOSIS — J029 Acute pharyngitis, unspecified: Secondary | ICD-10-CM | POA: Insufficient documentation

## 2017-08-15 DIAGNOSIS — Z5329 Procedure and treatment not carried out because of patient's decision for other reasons: Secondary | ICD-10-CM | POA: Diagnosis not present

## 2017-08-15 DIAGNOSIS — F17210 Nicotine dependence, cigarettes, uncomplicated: Secondary | ICD-10-CM | POA: Diagnosis not present

## 2017-08-15 DIAGNOSIS — R05 Cough: Secondary | ICD-10-CM | POA: Diagnosis not present

## 2017-08-15 DIAGNOSIS — R110 Nausea: Secondary | ICD-10-CM | POA: Diagnosis not present

## 2017-08-15 LAB — CBC WITH DIFFERENTIAL/PLATELET
Basophils Absolute: 0 10*3/uL (ref 0.0–0.1)
Basophils Relative: 0 %
Eosinophils Absolute: 0.1 10*3/uL (ref 0.0–0.7)
Eosinophils Relative: 0 %
HCT: 42.7 % (ref 36.0–46.0)
Hemoglobin: 14.7 g/dL (ref 12.0–15.0)
Lymphocytes Relative: 15 %
Lymphs Abs: 3.2 10*3/uL (ref 0.7–4.0)
MCH: 32.9 pg (ref 26.0–34.0)
MCHC: 34.4 g/dL (ref 30.0–36.0)
MCV: 95.5 fL (ref 78.0–100.0)
Monocytes Absolute: 1.8 10*3/uL — ABNORMAL HIGH (ref 0.1–1.0)
Monocytes Relative: 9 %
Neutro Abs: 16.2 10*3/uL — ABNORMAL HIGH (ref 1.7–7.7)
Neutrophils Relative %: 76 %
Platelets: 236 10*3/uL (ref 150–400)
RBC: 4.47 MIL/uL (ref 3.87–5.11)
RDW: 12.8 % (ref 11.5–15.5)
WBC: 21.3 10*3/uL — ABNORMAL HIGH (ref 4.0–10.5)

## 2017-08-15 LAB — URINALYSIS, ROUTINE W REFLEX MICROSCOPIC
Bacteria, UA: NONE SEEN
Bilirubin Urine: NEGATIVE
Glucose, UA: NEGATIVE mg/dL
Ketones, ur: NEGATIVE mg/dL
Leukocytes, UA: NEGATIVE
Nitrite: NEGATIVE
Protein, ur: NEGATIVE mg/dL
Specific Gravity, Urine: 1.004 — ABNORMAL LOW (ref 1.005–1.030)
pH: 6 (ref 5.0–8.0)

## 2017-08-15 LAB — COMPREHENSIVE METABOLIC PANEL
ALT: 112 U/L — ABNORMAL HIGH (ref 14–54)
AST: 65 U/L — ABNORMAL HIGH (ref 15–41)
Albumin: 3.7 g/dL (ref 3.5–5.0)
Alkaline Phosphatase: 102 U/L (ref 38–126)
Anion gap: 10 (ref 5–15)
BUN: <5 mg/dL — ABNORMAL LOW (ref 6–20)
CO2: 23 mmol/L (ref 22–32)
Calcium: 9 mg/dL (ref 8.9–10.3)
Chloride: 100 mmol/L — ABNORMAL LOW (ref 101–111)
Creatinine, Ser: 0.63 mg/dL (ref 0.44–1.00)
GFR calc Af Amer: >60 mL/min (ref >60)
GFR calc non Af Amer: >60 mL/min (ref >60)
Glucose, Bld: 79 mg/dL (ref 65–99)
Potassium: 4.1 mmol/L (ref 3.5–5.1)
Sodium: 133 mmol/L — ABNORMAL LOW (ref 135–145)
Total Bilirubin: 0.7 mg/dL (ref 0.3–1.2)
Total Protein: 7.4 g/dL (ref 6.5–8.1)

## 2017-08-15 NOTE — ED Triage Notes (Signed)
Patient arrives with complaint of sore throat, cough, fever, nausea, and 1 episode of emesis. Also states she had a positive home pregnancy test 2 weeks ago with a subsequent negative 1 week ago. Requests pregnancy test.

## 2017-08-16 LAB — I-STAT CG4 LACTIC ACID, ED: Lactic Acid, Venous: 0.8 mmol/L (ref 0.5–1.9)

## 2017-08-16 LAB — I-STAT BETA HCG BLOOD, ED (MC, WL, AP ONLY): I-stat hCG, quantitative: <5 m[IU]/mL (ref <5)

## 2017-08-16 LAB — RAPID STREP SCREEN (MED CTR MEBANE ONLY): Streptococcus, Group A Screen (Direct): POSITIVE — AB

## 2017-08-16 MED ORDER — PENICILLIN G BENZATHINE & PROC 1200000 UNIT/2ML IM SUSP
1.2000 10*6.[IU] | Freq: Once | INTRAMUSCULAR | Status: DC
  Filled 2017-08-16: qty 2

## 2017-08-16 MED ORDER — DEXAMETHASONE SODIUM PHOSPHATE 10 MG/ML IJ SOLN
10.0000 mg | Freq: Once | INTRAMUSCULAR | Status: AC
  Administered 2017-08-16: 10 mg via INTRAMUSCULAR

## 2017-08-16 MED ORDER — KETOROLAC TROMETHAMINE 30 MG/ML IJ SOLN
30.0000 mg | Freq: Once | INTRAMUSCULAR | Status: AC
  Administered 2017-08-16: 30 mg via INTRAMUSCULAR
  Filled 2017-08-16: qty 1

## 2017-08-16 MED ORDER — DEXAMETHASONE SODIUM PHOSPHATE 10 MG/ML IJ SOLN
10.0000 mg | Freq: Once | INTRAMUSCULAR | Status: DC
  Filled 2017-08-16: qty 1

## 2017-08-16 NOTE — ED Notes (Signed)
Patient is A&Ox4.  No signs of distress noted.  Please see providers complete history and physical exam.  

## 2017-08-16 NOTE — ED Provider Notes (Signed)
[facility] EMERGENCY DEPARTMENT Provider Note   CSN: [id number] Arrival date & time: 08/15/17  [facility]     History   Chief Complaint Chief Complaint  Patient presents with  . Fever  . Sore Throat  . Nausea    HPI Kimberly MiresBrittany G Barrera is a 27 y.o. female who presents for evaluation of 2 days of progressively worsening sore throat, fever, nausea.  Patient reports that for the last 2 days, she has had progressively worsening sore throat.  Patient had one episode of vomiting today. she states that she is still been able to tolerate p.o. without any difficulty.  Patient reports that she has been recording a fever of probably 101.1.  She has been taking Alka-Seltzer tablets for relief of fever.  No other medications.  Patient also reports she has been having intermittent cough that is productive of green sputum.  Patient reports that sputum will intermittently have strings of blood-tinged sputum.  She is not coughing up any gross blood.  Patient is concerned that she might be pregnant.  Patient reports that she was recently taken off of her birth control and she is currently sexually active with one partner.  Patient states that she has been able to eat and drink without difficulty but does note her pain is worse with swallowing.  She has been able to tolerate her secretions without any difficulty.  Patient denies any chest pain, difficulty breathing, abdominal pain, dysuria, hematuria.  The history is provided by the patient.    Past Medical History:  Diagnosis Date  . Abortion   . ADHD (attention deficit hyperactivity disorder)   . Anxiety   . Bipolar 1 disorder (HCC)   . Contraceptive education 10/11/2015  . Depression   . Hematuria 10/11/2015  . Pain with urination 10/11/2015  . Substance abuse (HCC)   . Vaginal discharge 10/11/2015    Patient Active Problem List   Diagnosis Date Noted  . Peripheral neuropathy 05/15/2016  . Pain with urination 10/11/2015  . Hematuria  10/11/2015  . Contraceptive education 10/11/2015  . Substance abuse (HCC) 05/24/2015  . History of migraine with aura 04/26/2015  . History of substance abuse 04/04/2015  . Chronic back pain 04/04/2015  . Contraception management 04/04/2015  . Bipolar 1 disorder, depressed (HCC) 04/02/2015  . ADD (attention deficit disorder) 04/02/2015  . Chronic insomnia 04/02/2015  . Anxiety, mild 03/06/2012    Past Surgical History:  Procedure Laterality Date  . INDUCED ABORTION    . WISDOM TOOTH EXTRACTION      OB History    Gravida Para Term Preterm AB Living   3 2 2  0 1 2   SAB TAB Ectopic Multiple Live Births   1 0 0 0 2       Home Medications    Prior to Admission medications   Medication Sig Start Date End Date Taking? Authorizing Provider  buPROPion (WELLBUTRIN XL) 300 MG 24 hr tablet Take 1 tablet daily Patient not taking: Reported on 01/27/2017 05/15/16   Salley Scarleturham, Kawanta F, MD  clindamycin-benzoyl peroxide [facility](BENZACLIN) gel Apply topically 2 (two) times daily. Patient not taking: Reported on 01/27/2017 05/15/16   Salley Scarleturham, Kawanta F, MD  etonogestrel (NEXPLANON) 68 MG IMPL implant 1 each (68 mg total) by Subdermal route once. 11/23/15   [place], Velna HatchetKawanta F, MD  lisdexamfetamine (VYVANSE) 60 MG capsule Take 1 capsule (60 mg total) by mouth every morning. Patient not taking: Reported on 01/27/2017 06/23/16   Salley Scarleturham, Kawanta F, MD  meloxicam (MOBIC) 7.5 MG tablet Take 1 tablet (7.5 mg total) by mouth daily. Patient not taking: Reported on 01/27/2017 05/15/16   Salley Scarlet, MD  naloxone [facility]) nasal spray 4 mg/0.1 mL Use for overdose 01/27/17   Melene Plan, DO  traZODone (DESYREL) 50 MG tablet Take 0.5-1 tablets (25-50 mg total) by mouth at bedtime. For sleep Patient not taking: Reported on 01/27/2017 05/15/16   Salley Scarlet, MD    Family History Family History  Problem Relation Age of Onset  . Hypertension Father   . COPD Maternal Grandmother   . Asthma Maternal Grandmother      Social History Social History   Tobacco Use  . Smoking status: Current Every Day Smoker    Packs/day: 1.00    Years: 8.00    Pack years: 8.00    Types: Cigarettes    Last attempt to quit: 04/20/2011    Years since quitting: 6.3  . Smokeless tobacco: Never Used  Substance Use Topics  . Alcohol use: No    Alcohol/week: 0.0 oz  . Drug use: No     Allergies   Bee venom   Review of Systems Review of Systems  Constitutional: Positive for fever.  HENT: Positive for sore throat. Negative for trouble swallowing.   Respiratory: Negative for cough and shortness of breath.   Cardiovascular: Negative for chest pain.  Gastrointestinal: Positive for nausea. Negative for abdominal pain and vomiting.  Genitourinary: Negative for dysuria and hematuria.     Physical Exam Updated Vital Signs BP 123/68 (BP Location: Right Arm)   Pulse 93   Temp 99.2 F (37.3 C) (Oral)   Resp 16   Ht 5\' 5"  (1.651 m)   Wt 61.2 kg (135 lb)   SpO2 100%   BMI 22.47 kg/m   Physical Exam  Constitutional: She is oriented to person, place, and time. She appears well-developed and well-nourished.  Laying down comfortably on examination table  HENT:  Head: Normocephalic and atraumatic.  Mouth/Throat: Uvula is midline and mucous membranes are normal. No trismus in the jaw. Posterior oropharyngeal edema and posterior oropharyngeal erythema present.  Bilateral tonsils are edematous but symmetric in appearance.  They are quite large but they do not case.  Uvula is midline.  No trismus.  No evidence of peritonsillar abscess.  No facial or neck swelling.  Eyes: Conjunctivae, EOM and lids are normal. Pupils are equal, round, and reactive to light.  Neck: Full passive range of motion without pain. Neck supple. No neck rigidity.  Cardiovascular: Normal rate, regular rhythm, normal heart sounds and normal pulses.  Pulses:      Radial pulses are 2+ on the right side, and 2+ on the left side.  Pulmonary/Chest:  Effort normal and breath sounds normal.  Abdominal: Soft. Normal appearance. There is no tenderness. There is no rigidity and no guarding.  Musculoskeletal: Normal range of motion.  Lymphadenopathy:    She has no cervical adenopathy.  Neurological: She is alert and oriented to person, place, and time.  Skin: Skin is warm and dry. Capillary refill takes less than 2 seconds.  Psychiatric: She has a normal mood and affect. Her speech is normal.  Nursing note and vitals reviewed.    ED Treatments / Results  Labs (all labs ordered are listed, but only abnormal results are displayed) Labs Reviewed  RAPID STREP SCREEN (NOT AT [facility]) - Abnormal; Notable for the following components:      Result Value   Streptococcus, Group A Screen (  Direct) POSITIVE (*)    All other components within normal limits  COMPREHENSIVE METABOLIC PANEL - Abnormal; Notable for the following components:   Sodium 133 (*)    Chloride 100 (*)    BUN <5 (*)    AST 65 (*)    ALT 112 (*)    All other components within normal limits  CBC WITH DIFFERENTIAL/PLATELET - Abnormal; Notable for the following components:   WBC 21.3 (*)    Neutro Abs 16.2 (*)    Monocytes Absolute 1.8 (*)    All other components within normal limits  URINALYSIS, ROUTINE W REFLEX MICROSCOPIC - Abnormal; Notable for the following components:   Specific Gravity, Urine 1.004 (*)    Hgb urine dipstick SMALL (*)    Squamous Epithelial / LPF 0-5 (*)    All other components within normal limits  I-STAT CG4 LACTIC ACID, ED  I-STAT BETA HCG BLOOD, ED ([facility], [facility], [facility] ONLY)  I-STAT CG4 LACTIC ACID, ED    EKG  EKG Interpretation None       Radiology Dg Chest 2 View  Result Date: 08/15/2017 CLINICAL DATA:  Cough, spitting up blood and with fever x2 days. EXAM: CHEST  2 VIEW COMPARISON:  01/27/2017 FINDINGS: The heart size and mediastinal contours are within normal limits. There is a 13 x 9 mm of density seen in the right upper lobe, nonspecific in  etiology can't not apparent on prior study. Findings may represent a focus of atelectasis, inflammation or possibly infection. Neoplasm would be unusual. The visualized skeletal structures are unremarkable. IMPRESSION: Faint 13 x 9 mm right upper lobe opacity is noted, nonspecific potentially postinfectious or inflammatory in etiology. Neoplasm would be unusual given age. Consider short-term interval follow-up or chest CT. Electronically Signed   By: Tollie Eth M.D.   On: 08/15/2017 22:26    Procedures Procedures (including critical care time)  Medications Ordered in ED Medications  ketorolac (TORADOL) 30 MG/ML injection 30 mg (30 mg Intramuscular Given 08/16/17 0121)  dexamethasone (DECADRON) injection 10 mg (10 mg Intramuscular Given 08/16/17 0121)     Initial Impression / Assessment and Plan / ED Course  I have reviewed the triage vital signs and the nursing notes.  Pertinent labs & imaging results that were available during my care of the patient were reviewed by me and considered in my medical decision making (see chart for details).     27 y.o. female who presents for evaluation of 2 days of sore throat, fever, nausea.  Patient had one episode of vomiting but has been able to tolerate p.o.  Patient also concerned she might be pregnant because she was recently taken off her birth control. Patient is afebrile, non-toxic appearing, sitting comfortably on examination table. Vital signs reviewed and stable.  No evidence of respiratory distress.  O2 sats are stable.  Patient is tolerating secretions without any difficulty.  Has been drinking from a soda without any difficulty.  On physical exam, stare oropharynx erythema.  Bilateral tonsils are edematous but symmetric in appearance.  They do not quite kiss.  Uvula is midline.  No trismus.  Lungs clear to auscultation bilaterally.  No evidence of wheezing, rales.  Consider pharyngitis versus bronchitis versus pneumonia.  History/physical exam are not  concerning for peritonsillar abscess or Ludwig angina.  Initial labs and imaging ordered at triage.  Will add additional rapid strep.  Labs and imaging reviewed.  I-STAT lactic acid negative.  I-STAT beta negative.  CMP unremarkable.  CBC shows leukocytosis  of 21.3.  UA negative for any acute infectious etiology.  Chest x-ray shows a faint area of density that could indicate pneumonia.  I personally did not hear anything on lung exam.  Rapid strep is pending.  I-STAT beta is negative.  We will plan to give Decadron and Toradol for symptomatic relief in the department.  Rapid strep positive.  Will plan to give antiobiotic treatment in the department.  Given concerns of possible pneumonia along with fever and cough, will plan to send patient home on outpatient antibiotics for further treatment.  RN informed me that he went to go give patient antibiotics here in the department and that she had left the room and all her stuff was gone.  Patient eloped from the emergency department.    Final Clinical Impressions(s) / ED Diagnoses   Final diagnoses:  None    ED Discharge Orders    None       Rosana Hoes 08/16/17 1610    Dione Booze, MD 08/16/17 [phone]

## 2017-08-16 NOTE — ED Notes (Signed)
Pt left with out being reseen by the doctor. Pt did not receive any other treatment due to leaving early. Pt left without signing or being able to get vitals on pt.

## 2017-12-19 ENCOUNTER — Emergency Department (HOSPITAL_COMMUNITY)
Admission: EM | Admit: 2017-12-19 | Discharge: 2017-12-20 | Disposition: A | Discharge status: Home / Self Care | Payer: Medicaid - Out of State | Attending: Emergency Medicine | Admitting: Emergency Medicine

## 2017-12-19 ENCOUNTER — Encounter (HOSPITAL_COMMUNITY): Payer: Self-pay | Admitting: Emergency Medicine

## 2017-12-19 ENCOUNTER — Other Ambulatory Visit: Payer: Self-pay

## 2017-12-19 DIAGNOSIS — R112 Nausea with vomiting, unspecified: Secondary | ICD-10-CM | POA: Insufficient documentation

## 2017-12-19 DIAGNOSIS — Z5321 Procedure and treatment not carried out due to patient leaving prior to being seen by health care provider: Secondary | ICD-10-CM | POA: Diagnosis not present

## 2017-12-19 DIAGNOSIS — R109 Unspecified abdominal pain: Secondary | ICD-10-CM | POA: Diagnosis not present

## 2017-12-19 DIAGNOSIS — O219 Vomiting of pregnancy, unspecified: Secondary | ICD-10-CM | POA: Diagnosis not present

## 2017-12-19 DIAGNOSIS — O26891 Other specified pregnancy related conditions, first trimester: Secondary | ICD-10-CM | POA: Diagnosis not present

## 2017-12-19 NOTE — ED Triage Notes (Signed)
Patient is here with abdominal cramping that started a few days ago, states that it feels like a ripping sensation on her right side.  Patient with nausea and vomiting that stopped today.  She states that she felt good today but about one hour ago she started feeling the cramping.  She states that her period was abnormal in March and she missed it in April. She has had two positive home pregnancy tests.  No vaginal bleeding at this time.

## 2017-12-20 ENCOUNTER — Other Ambulatory Visit (HOSPITAL_COMMUNITY): Payer: Medicaid - Out of State

## 2017-12-20 ENCOUNTER — Encounter (HOSPITAL_COMMUNITY): Payer: Self-pay | Admitting: Emergency Medicine

## 2017-12-20 ENCOUNTER — Inpatient Hospital Stay (HOSPITAL_COMMUNITY): Payer: Medicaid - Out of State

## 2017-12-20 ENCOUNTER — Inpatient Hospital Stay (EMERGENCY_DEPARTMENT_HOSPITAL)
Admission: AD | Admit: 2017-12-20 | Discharge: 2017-12-20 | Disposition: A | Discharge status: Home / Self Care | Payer: Medicaid - Out of State | Source: Ambulatory Visit | Attending: Obstetrics & Gynecology | Admitting: Obstetrics & Gynecology

## 2017-12-20 DIAGNOSIS — F909 Attention-deficit hyperactivity disorder, unspecified type: Secondary | ICD-10-CM

## 2017-12-20 DIAGNOSIS — O99331 Smoking (tobacco) complicating pregnancy, first trimester: Secondary | ICD-10-CM | POA: Insufficient documentation

## 2017-12-20 DIAGNOSIS — Z3A08 8 weeks gestation of pregnancy: Secondary | ICD-10-CM | POA: Insufficient documentation

## 2017-12-20 DIAGNOSIS — O26891 Other specified pregnancy related conditions, first trimester: Secondary | ICD-10-CM

## 2017-12-20 DIAGNOSIS — F419 Anxiety disorder, unspecified: Secondary | ICD-10-CM

## 2017-12-20 DIAGNOSIS — Z79899 Other long term (current) drug therapy: Secondary | ICD-10-CM

## 2017-12-20 DIAGNOSIS — O219 Vomiting of pregnancy, unspecified: Secondary | ICD-10-CM

## 2017-12-20 DIAGNOSIS — F319 Bipolar disorder, unspecified: Secondary | ICD-10-CM

## 2017-12-20 DIAGNOSIS — F17210 Nicotine dependence, cigarettes, uncomplicated: Secondary | ICD-10-CM

## 2017-12-20 DIAGNOSIS — O99341 Other mental disorders complicating pregnancy, first trimester: Secondary | ICD-10-CM | POA: Insufficient documentation

## 2017-12-20 DIAGNOSIS — Z3491 Encounter for supervision of normal pregnancy, unspecified, first trimester: Secondary | ICD-10-CM

## 2017-12-20 DIAGNOSIS — R109 Unspecified abdominal pain: Secondary | ICD-10-CM

## 2017-12-20 LAB — URINALYSIS, ROUTINE W REFLEX MICROSCOPIC
Bilirubin Urine: NEGATIVE
Bilirubin Urine: NEGATIVE
Glucose, UA: NEGATIVE mg/dL
Glucose, UA: NEGATIVE mg/dL
Hgb urine dipstick: NEGATIVE
Hgb urine dipstick: NEGATIVE
Ketones, ur: NEGATIVE mg/dL
Ketones, ur: NEGATIVE mg/dL
Leukocytes, UA: NEGATIVE
Leukocytes, UA: NEGATIVE
Nitrite: NEGATIVE
Nitrite: NEGATIVE
Protein, ur: NEGATIVE mg/dL
Protein, ur: NEGATIVE mg/dL
Specific Gravity, Urine: 1.009 (ref 1.005–1.030)
Specific Gravity, Urine: 1.015 (ref 1.005–1.030)
pH: 6 (ref 5.0–8.0)
pH: 7 (ref 5.0–8.0)

## 2017-12-20 LAB — CBC
HCT: 41.7 % (ref 36.0–46.0)
Hemoglobin: 14.5 g/dL (ref 12.0–15.0)
MCH: 32.7 pg (ref 26.0–34.0)
MCHC: 34.8 g/dL (ref 30.0–36.0)
MCV: 93.9 fL (ref 78.0–100.0)
Platelets: 235 10*3/uL (ref 150–400)
RBC: 4.44 MIL/uL (ref 3.87–5.11)
RDW: 12.7 % (ref 11.5–15.5)
WBC: 13.8 10*3/uL — ABNORMAL HIGH (ref 4.0–10.5)

## 2017-12-20 LAB — COMPREHENSIVE METABOLIC PANEL
ALT: 48 U/L (ref 14–54)
AST: 35 U/L (ref 15–41)
Albumin: 3.7 g/dL (ref 3.5–5.0)
Alkaline Phosphatase: 71 U/L (ref 38–126)
Anion gap: 8 (ref 5–15)
BUN: <5 mg/dL — ABNORMAL LOW (ref 6–20)
CO2: 23 mmol/L (ref 22–32)
Calcium: 9.1 mg/dL (ref 8.9–10.3)
Chloride: 103 mmol/L (ref 101–111)
Creatinine, Ser: 0.59 mg/dL (ref 0.44–1.00)
GFR calc Af Amer: >60 mL/min (ref >60)
GFR calc non Af Amer: >60 mL/min (ref >60)
Glucose, Bld: 99 mg/dL (ref 65–99)
Potassium: 3.8 mmol/L (ref 3.5–5.1)
Sodium: 134 mmol/L — ABNORMAL LOW (ref 135–145)
Total Bilirubin: 0.6 mg/dL (ref 0.3–1.2)
Total Protein: 6.7 g/dL (ref 6.5–8.1)

## 2017-12-20 LAB — HCG, SERUM, QUALITATIVE: Preg, Serum: POSITIVE — AB

## 2017-12-20 LAB — ABO/RH: ABO/RH(D): B POS

## 2017-12-20 LAB — HCG, QUANTITATIVE, PREGNANCY: hCG, Beta Chain, Quant, S: 90041 m[IU]/mL — ABNORMAL HIGH (ref <5)

## 2017-12-20 LAB — LIPASE, BLOOD: Lipase: 40 U/L (ref 11–51)

## 2017-12-20 MED ORDER — ONDANSETRON 4 MG PO TBDP
4.0000 mg | ORAL_TABLET | Freq: Once | ORAL | Status: AC
  Administered 2017-12-20: 4 mg via ORAL
  Filled 2017-12-20: qty 1

## 2017-12-20 MED ORDER — METOCLOPRAMIDE HCL 10 MG PO TABS: 10.0000 mg | ORAL_TABLET | Freq: Four times a day (QID) | ORAL | 0 refills | Status: DC

## 2017-12-20 NOTE — Discharge Instructions (Signed)

## 2017-12-20 NOTE — MAU Provider Note (Signed)
History     CSN: [id number]  Arrival date and time: 12/20/17 8413   First Provider Initiated Contact with Patient 12/20/17 2042      Chief Complaint  Patient presents with  . Abdominal Cramping   HPI  Kimberly Barrera is a 27 y.o. [phone] at [id number] LMP who presents with abdominal pain and nausea. Was seen at ED last night & had HCG drawn but no ultrasound and was given zofran for nausea. Reports lower abdominal cramping that is worse in RLQ. Rates pain 4/10. Nothing makes pain better or worse and has not treated symptoms. Denies diarrhea, constipation, dysuria, vaginal bleeding, or vaginal discharge. Endorses n/v. States she vomits about 4 times per day. Does not have antiemetic at home. Currently not nauseated. Symptoms are worse in the morning. Has not states prenatal care.   OB History    Gravida  4   Para  2   Term  2   Preterm  0   AB  1   Living  2     SAB  1   TAB  0   Ectopic  0   Multiple  0   Live Births  2           Past Medical History:  Diagnosis Date  . Abortion   . ADHD (attention deficit hyperactivity disorder)   . Anxiety   . Bipolar 1 disorder ([facility]   . Depression   . Hematuria 10/11/2015  . Pain with urination 10/11/2015  . Substance abuse ([facility]   . Vaginal discharge 10/11/2015    Past Surgical History:  Procedure Laterality Date  . INDUCED ABORTION    . WISDOM TOOTH EXTRACTION      Family History  Problem Relation Age of Onset  . Hypertension Father   . COPD Maternal Grandmother   . Asthma Maternal Grandmother     Social History   Tobacco Use  . Smoking status: Current Every Day Smoker    Packs/day: 1.00    Years: 8.00    Pack years: 8.00    Types: Cigarettes    Last attempt to quit: 04/20/2011    Years since quitting: 6.6  . Smokeless tobacco: Never Used  Substance Use Topics  . Alcohol use: No    Alcohol/week: 0.0 oz  . Drug use: No    Allergies:  Allergies  Allergen Reactions  . Bee Venom Swelling     Medications Prior to Admission  Medication Sig Dispense Refill Last Dose  . buPROPion (WELLBUTRIN XL) 300 MG 24 hr tablet Take 1 tablet daily (Patient not taking: Reported on 01/27/2017) 30 tablet 3 More than a month at Unknown time  . clindamycin-benzoyl peroxide (BENZACLIN) gel Apply topically 2 (two) times daily. (Patient not taking: Reported on 01/27/2017) 25 g 3 More than a month at Unknown time  . etonogestrel (NEXPLANON) 68 MG IMPL implant 1 each (68 mg total) by Subdermal route once. 1 each 0 Taking  . lisdexamfetamine (VYVANSE) 60 MG capsule Take 1 capsule (60 mg total) by mouth every morning. (Patient not taking: Reported on 01/27/2017) 30 capsule 0 More than a month at Unknown time  . meloxicam (MOBIC) 7.5 MG tablet Take 1 tablet (7.5 mg total) by mouth daily. (Patient not taking: Reported on 01/27/2017) 30 tablet 1 More than a month at Unknown time  . naloxone (NARCAN) nasal spray 4 mg/0.1 mL Use for overdose 1 kit 0 More than a month at Unknown time  . traZODone (DESYREL) 50  MG tablet Take 0.5-1 tablets (25-50 mg total) by mouth at bedtime. For sleep (Patient not taking: Reported on 01/27/2017) 30 tablet 3 More than a month at Unknown time    Review of Systems  Constitutional: Negative.   Gastrointestinal: Positive for abdominal pain, nausea and vomiting. Negative for constipation and diarrhea.  Genitourinary: Negative.    Physical Exam   Blood pressure 108/65, pulse 67, temperature 98.1 F (36.7 C), temperature source Oral, resp. rate 16, last menstrual period 10/22/2017, SpO2 100 %.  Physical Exam  Nursing note and vitals reviewed. Constitutional: She is oriented to person, place, and time. She appears well-developed and well-nourished. No distress.  HENT:  Head: Normocephalic and atraumatic.  Eyes: Conjunctivae are normal. Right eye exhibits no discharge. Left eye exhibits no discharge. No scleral icterus.  Neck: Normal range of motion.  Cardiovascular: Normal rate,  regular rhythm and normal heart sounds.  No murmur heard. Respiratory: Effort normal and breath sounds normal. No respiratory distress. She has no wheezes.  GI: Soft. Bowel sounds are normal. She exhibits no distension. There is no tenderness. There is no rebound and no guarding.  Neurological: She is alert and oriented to person, place, and time.  Skin: Skin is warm and dry. She is not diaphoretic.  Psychiatric: She has a normal mood and affect. Her behavior is normal. Judgment and thought content normal.    MAU Course  Procedures Results for orders placed or performed during the hospital encounter of 12/20/17 (from the past 24 hour(s))  Urinalysis, Routine w reflex microscopic     Status: None   Collection Time: 12/20/17  7:43 PM  Result Value Ref Range   Color, Urine YELLOW YELLOW   APPearance CLEAR CLEAR   Specific Gravity, Urine 1.015 1.005 - 1.030   pH 6.0 5.0 - 8.0   Glucose, UA NEGATIVE NEGATIVE mg/dL   Hgb urine dipstick NEGATIVE NEGATIVE   Bilirubin Urine NEGATIVE NEGATIVE   Ketones, ur NEGATIVE NEGATIVE mg/dL   Protein, ur NEGATIVE NEGATIVE mg/dL   Nitrite NEGATIVE NEGATIVE   Leukocytes, UA NEGATIVE NEGATIVE   US Ob Less Than 14 Weeks With Ob Transvaginal  Result Date: 12/20/2017 CLINICAL DATA:  Pelvic pain. Pregnant patient. Patient is 8 weeks and 3 days pregnant based on her last menstrual period. Beta HCG level is 90,041. EXAM: OBSTETRIC <14 WK [place] AND TRANSVAGINAL OB US TECHNIQUE: Both transabdominal and transvaginal ultrasound examinations were performed for complete evaluation of the gestation as well as the maternal uterus, adnexal regions, and pelvic cul-de-sac. Transvaginal technique was performed to assess early pregnancy. COMPARISON:  None. FINDINGS: Intrauterine gestational sac: Single Yolk sac:  Visualized. Embryo:  Visualized. Cardiac Activity: Visualized. Heart Rate: 137 bpm CRL:  9.1 mm   6 w   6 d                  [place] EDC: 08/09/2018 Subchorionic hemorrhage:   Moderate-sized subchronic hemorrhage Maternal uterus/adnexae: No uterine masses. Cervix is unremarkable. Normal appearance of the ovaries. No adnexal masses. No pelvic free fluid. IMPRESSION: 1. Single live anterior pregnancy with a measured gestational age of [redacted] weeks and 6 days. 2. Moderate-sized subchronic hemorrhage. No other pregnancy complication. 3. Ovaries and adnexa are unremarkable.  No pelvic free fluid. Electronically Signed   By: Lajean Manes M.D.   On: 12/20/2017 22:00    MDM HCG last night was 90,041. Ultrasound tonight shows SIUP with cardiac activity that measures [redacted]w[redacted]d will updated EDD. Small CLC.    Assessment and  Plan  A; 1. Normal IUP (intrauterine pregnancy) on prenatal ultrasound, first trimester   2. Abdominal pain during pregnancy in first trimester   3. Nausea and vomiting during pregnancy prior to [redacted] weeks gestation    P: Discharge home Rx reglan Start prenatal care Discussed reasons to return to [place] 12/20/2017, 8:42 PM

## 2017-12-20 NOTE — MAU Note (Signed)
Pt arrives to MAU w/complaints of lower abd cramping on the right side and some nausea. Pt was at [facility] yesterday and they collected a quant. Pt denies any bleeding.

## 2018-01-09 ENCOUNTER — Inpatient Hospital Stay (HOSPITAL_COMMUNITY)
Admission: AD | Admit: 2018-01-09 | Discharge: 2018-01-09 | Disposition: A | Discharge status: Home / Self Care | Payer: Medicaid - Out of State | Source: Ambulatory Visit | Attending: Obstetrics and Gynecology | Admitting: Obstetrics and Gynecology

## 2018-01-09 ENCOUNTER — Encounter (HOSPITAL_COMMUNITY): Payer: Self-pay

## 2018-01-09 ENCOUNTER — Other Ambulatory Visit: Payer: Self-pay

## 2018-01-09 DIAGNOSIS — R3915 Urgency of urination: Secondary | ICD-10-CM | POA: Diagnosis not present

## 2018-01-09 DIAGNOSIS — O99331 Smoking (tobacco) complicating pregnancy, first trimester: Secondary | ICD-10-CM | POA: Insufficient documentation

## 2018-01-09 DIAGNOSIS — Z3A09 9 weeks gestation of pregnancy: Secondary | ICD-10-CM | POA: Insufficient documentation

## 2018-01-09 DIAGNOSIS — F17210 Nicotine dependence, cigarettes, uncomplicated: Secondary | ICD-10-CM | POA: Insufficient documentation

## 2018-01-09 DIAGNOSIS — Z79899 Other long term (current) drug therapy: Secondary | ICD-10-CM | POA: Diagnosis not present

## 2018-01-09 DIAGNOSIS — Z825 Family history of asthma and other chronic lower respiratory diseases: Secondary | ICD-10-CM | POA: Insufficient documentation

## 2018-01-09 DIAGNOSIS — F419 Anxiety disorder, unspecified: Secondary | ICD-10-CM | POA: Insufficient documentation

## 2018-01-09 DIAGNOSIS — Z9889 Other specified postprocedural states: Secondary | ICD-10-CM | POA: Insufficient documentation

## 2018-01-09 DIAGNOSIS — F909 Attention-deficit hyperactivity disorder, unspecified type: Secondary | ICD-10-CM | POA: Diagnosis not present

## 2018-01-09 DIAGNOSIS — Z91030 Bee allergy status: Secondary | ICD-10-CM | POA: Insufficient documentation

## 2018-01-09 DIAGNOSIS — R300 Dysuria: Secondary | ICD-10-CM | POA: Insufficient documentation

## 2018-01-09 DIAGNOSIS — F319 Bipolar disorder, unspecified: Secondary | ICD-10-CM | POA: Insufficient documentation

## 2018-01-09 DIAGNOSIS — Z8249 Family history of ischemic heart disease and other diseases of the circulatory system: Secondary | ICD-10-CM | POA: Diagnosis not present

## 2018-01-09 DIAGNOSIS — O210 Mild hyperemesis gravidarum: Secondary | ICD-10-CM | POA: Diagnosis not present

## 2018-01-09 DIAGNOSIS — O26891 Other specified pregnancy related conditions, first trimester: Secondary | ICD-10-CM | POA: Diagnosis present

## 2018-01-09 DIAGNOSIS — O99341 Other mental disorders complicating pregnancy, first trimester: Secondary | ICD-10-CM | POA: Diagnosis not present

## 2018-01-09 LAB — URINALYSIS, ROUTINE W REFLEX MICROSCOPIC
Bilirubin Urine: NEGATIVE
Glucose, UA: NEGATIVE mg/dL
Hgb urine dipstick: NEGATIVE
Ketones, ur: 5 mg/dL — AB
Leukocytes, UA: NEGATIVE
Nitrite: NEGATIVE
Protein, ur: NEGATIVE mg/dL
Specific Gravity, Urine: 1.023 (ref 1.005–1.030)
pH: 6 (ref 5.0–8.0)

## 2018-01-09 MED ORDER — PROMETHAZINE HCL 25 MG PO TABS: 12.5000 mg | ORAL_TABLET | Freq: Four times a day (QID) | ORAL | 1 refills | Status: DC | PRN

## 2018-01-09 NOTE — MAU Provider Note (Signed)
History     CSN: [id number]  Arrival date and time: 01/09/18 1531  Chief Complaint  Patient presents with  . Emesis  . Nausea  . Dysuria   [id number]  here with N/V, and urinary urgency/dysuria. Nausea started weeks ago, vomiting started 2 days ago. She's using Reglan but it's only helping a little. She was able to eat [place] food today and tolerated that. No sick contacts. Urinary sx started 1 week ago. No hematuria. No fevers. No abd pain or VB.  OB History    Gravida  5   Para  2   Term  2   Preterm  0   AB  2   Living  2     SAB  1   TAB  1   Ectopic  0   Multiple  0   Live Births  2           Past Medical History:  Diagnosis Date  . Abortion   . ADHD (attention deficit hyperactivity disorder)   . Anxiety   . Bipolar 1 disorder (HCC)   . Depression   . Hematuria 10/11/2015  . Pain with urination 10/11/2015  . Substance abuse (HCC)   . Vaginal discharge 10/11/2015    Past Surgical History:  Procedure Laterality Date  . INDUCED ABORTION    . WISDOM TOOTH EXTRACTION      Family History  Problem Relation Age of Onset  . Hypertension Father   . COPD Maternal Grandmother   . Asthma Maternal Grandmother     Social History   Tobacco Use  . Smoking status: Current Every Day Smoker    Packs/day: 0.25    Years: 8.00    Pack years: 2.00    Types: Cigarettes  . Smokeless tobacco: Never Used  Substance Use Topics  . Alcohol use: No    Alcohol/week: 0.0 oz  . Drug use: No    Allergies:  Allergies  Allergen Reactions  . Bee Venom Swelling    Medications Prior to Admission  Medication Sig Dispense Refill Last Dose  . metoCLOPramide (REGLAN) 10 MG tablet Take 1 tablet (10 mg total) by mouth every 6 (six) hours. 30 tablet 0     Review of Systems  Constitutional: Negative for fever.  Gastrointestinal: Positive for nausea and vomiting. Negative for abdominal pain, constipation and diarrhea.  Genitourinary: Positive for dysuria and  urgency. Negative for frequency, hematuria and vaginal bleeding.   Physical Exam   Blood pressure 124/78, pulse 76, temperature 98.6 F (37 C), temperature source Oral, resp. rate 16, weight 134 lb 12 oz (61.1 kg), last menstrual period 10/22/2017, SpO2 98 %.  Physical Exam  Constitutional: She appears well-developed and well-nourished. No distress.  HENT:  Head: Normocephalic and atraumatic.  Neck: Normal range of motion.  Cardiovascular: Normal rate.  Respiratory: Effort normal. No respiratory distress.  GI: Soft. She exhibits no distension and no mass. There is no tenderness. There is no rebound and no guarding.  Musculoskeletal: Normal range of motion.  Neurological: She is alert.  Skin: Skin is warm and dry.  Psychiatric: She has a normal mood and affect.   Results for orders placed or performed during the hospital encounter of 01/09/18 (from the past 24 hour(s))  Urinalysis, Routine w reflex microscopic     Status: Abnormal   Collection Time: 01/09/18  3:45 PM  Result Value Ref Range   Color, Urine YELLOW YELLOW   APPearance CLEAR CLEAR   Specific Gravity, Urine 1.023  1.005 - 1.030   pH 6.0 5.0 - 8.0   Glucose, UA NEGATIVE NEGATIVE mg/dL   Hgb urine dipstick NEGATIVE NEGATIVE   Bilirubin Urine NEGATIVE NEGATIVE   Ketones, ur 5 (A) NEGATIVE mg/dL   Protein, ur NEGATIVE NEGATIVE mg/dL   Nitrite NEGATIVE NEGATIVE   Leukocytes, UA NEGATIVE NEGATIVE   MAU Course  Procedures  MDM Labs ordered and reviewed. Mild dehydration noted, IVF not indicated today. No evidence of UTI, will send UC. Will try Phenergan for N/V. Stable for discharge home.  Assessment and Plan   1. [redacted] weeks gestation of pregnancy   2. Dysuria   3. Morning sickness    Discharge home Follow up with OB provider of choice in 1-2 weeks to start care Rx Phenergan  Allergies as of 01/09/2018      Reactions   Bee Venom Swelling      Medication List    TAKE these medications   metoCLOPramide 10 MG  tablet Commonly known as:  REGLAN Take 1 tablet (10 mg total) by mouth every 6 (six) hours.   promethazine 25 MG tablet Commonly known as:  PHENERGAN Take 0.5-1 tablets (12.5-25 mg total) by mouth every 6 (six) hours as needed for nausea or vomiting.      Donette Larry, CNM 01/09/2018, 4:29 PM

## 2018-01-09 NOTE — Discharge Instructions (Signed)

## 2018-01-09 NOTE — MAU Note (Addendum)
Been really nauseated. Can't keep anything down.  (pt seen eating chipolte in the lobby).  Also thinks she might have a UTI, states she gets them really often.  Burning when she pees, pressure, feels like it needs to come out and it won't.

## 2018-01-11 LAB — CULTURE, OB URINE

## 2018-01-28 LAB — OB RESULTS CONSOLE GC/CHLAMYDIA
Chlamydia: NEGATIVE
Gonorrhea: NEGATIVE

## 2018-01-28 LAB — OB RESULTS CONSOLE RPR: RPR: NONREACTIVE

## 2018-01-28 LAB — OB RESULTS CONSOLE HIV ANTIBODY (ROUTINE TESTING): HIV: NONREACTIVE

## 2018-01-28 LAB — OB RESULTS CONSOLE RUBELLA ANTIBODY, IGM: Rubella: IMMUNE

## 2018-01-28 LAB — OB RESULTS CONSOLE HEPATITIS B SURFACE ANTIGEN: Hepatitis B Surface Ag: NEGATIVE

## 2018-02-21 MED FILL — BUPRENORPHINE 8 MG TABLET S: 8 | 7 days supply | Qty: 14 | Fill #0

## 2018-02-28 MED FILL — SUBOXONE 8 MG-2 MG SL FILM: 8-2 | 28 days supply | Qty: 56 | Fill #0

## 2018-03-13 ENCOUNTER — Inpatient Hospital Stay (HOSPITAL_COMMUNITY): Payer: Medicaid Other

## 2018-03-13 ENCOUNTER — Inpatient Hospital Stay (HOSPITAL_COMMUNITY)
Admission: AD | Admit: 2018-03-13 | Discharge: 2018-03-13 | Discharge status: Against Medical Advice | Payer: Medicaid Other | Attending: Obstetrics and Gynecology | Admitting: Obstetrics and Gynecology

## 2018-03-13 ENCOUNTER — Encounter (HOSPITAL_COMMUNITY): Payer: Self-pay

## 2018-03-13 ENCOUNTER — Inpatient Hospital Stay (HOSPITAL_BASED_OUTPATIENT_CLINIC_OR_DEPARTMENT_OTHER): Payer: Medicaid Other

## 2018-03-13 ENCOUNTER — Other Ambulatory Visit: Payer: Self-pay

## 2018-03-13 DIAGNOSIS — W19XXXA Unspecified fall, initial encounter: Secondary | ICD-10-CM | POA: Insufficient documentation

## 2018-03-13 DIAGNOSIS — O99322 Drug use complicating pregnancy, second trimester: Secondary | ICD-10-CM | POA: Diagnosis not present

## 2018-03-13 DIAGNOSIS — O26892 Other specified pregnancy related conditions, second trimester: Secondary | ICD-10-CM | POA: Diagnosis not present

## 2018-03-13 DIAGNOSIS — F1910 Other psychoactive substance abuse, uncomplicated: Secondary | ICD-10-CM

## 2018-03-13 DIAGNOSIS — S3991XA Unspecified injury of abdomen, initial encounter: Secondary | ICD-10-CM

## 2018-03-13 DIAGNOSIS — O99332 Smoking (tobacco) complicating pregnancy, second trimester: Secondary | ICD-10-CM | POA: Insufficient documentation

## 2018-03-13 DIAGNOSIS — Z3A18 18 weeks gestation of pregnancy: Secondary | ICD-10-CM | POA: Insufficient documentation

## 2018-03-13 DIAGNOSIS — O9A212 Injury, poisoning and certain other consequences of external causes complicating pregnancy, second trimester: Secondary | ICD-10-CM

## 2018-03-13 DIAGNOSIS — O9989 Other specified diseases and conditions complicating pregnancy, childbirth and the puerperium: Secondary | ICD-10-CM | POA: Diagnosis not present

## 2018-03-13 DIAGNOSIS — F17210 Nicotine dependence, cigarettes, uncomplicated: Secondary | ICD-10-CM | POA: Insufficient documentation

## 2018-03-13 DIAGNOSIS — Z3A20 20 weeks gestation of pregnancy: Secondary | ICD-10-CM | POA: Diagnosis not present

## 2018-03-13 LAB — URINALYSIS, ROUTINE W REFLEX MICROSCOPIC
Bacteria, UA: NONE SEEN
Bilirubin Urine: NEGATIVE
Glucose, UA: NEGATIVE mg/dL
Hgb urine dipstick: NEGATIVE
Ketones, ur: 80 mg/dL — AB
Leukocytes, UA: NEGATIVE
Nitrite: NEGATIVE
Protein, ur: 30 mg/dL — AB
Specific Gravity, Urine: 1.025 (ref 1.005–1.030)
Trans Epithel, UA: 1
pH: 6 (ref 5.0–8.0)

## 2018-03-13 LAB — RAPID URINE DRUG SCREEN, HOSP PERFORMED
Amphetamines: POSITIVE — AB
Barbiturates: NOT DETECTED
Benzodiazepines: POSITIVE — AB
Cocaine: POSITIVE — AB
Opiates: POSITIVE — AB
Tetrahydrocannabinol: POSITIVE — AB

## 2018-03-13 MED ORDER — BACITRACIN-NEOMYCIN-POLYMYXIN OINTMENT TUBE
TOPICAL_OINTMENT | CUTANEOUS | Status: DC | PRN
  Filled 2018-03-13: qty 14.17

## 2018-03-13 MED ORDER — BACITRACIN-NEOMYCIN-POLYMYXIN 400-5-5000 EX OINT
TOPICAL_OINTMENT | CUTANEOUS | Status: DC | PRN
  Administered 2018-03-13: 21:00:00 via TOPICAL
  Filled 2018-03-13: qty 1
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
## 2018-03-13 NOTE — MAU Note (Signed)
TTS is at bedside 

## 2018-03-13 NOTE — MAU Note (Addendum)
At 2135 pt walked off unit stating she was going to grab something out of her car. Pt informed CT was ready for her. Pt stated "ill be right back" pt took her kids to the car with her mother and has not returned to the unit at 2210. Provider notified. CT notified. House coverage notified.

## 2018-03-13 NOTE — MAU Note (Signed)
TTS called and was transferred to Wynelle BourgeoisMarie Williams CNM for report to be given.

## 2018-03-13 NOTE — BH Assessment (Signed)
Disposition Initial Assessment Completed for this Encounter: Yes  Nira ConnJason Berry, NP, recommended overnight observation for safety with reevaluation from psychiatry in the a.m. Georgia DomMaria Williams and Leotis ShamesLauren, RN, notified of disposition.
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
## 2018-03-28 MED FILL — SUBOXONE 8 MG-2 MG SL FILM: 8-2 | 14 days supply | Qty: 42 | Fill #0

## 2018-04-03 ENCOUNTER — Other Ambulatory Visit: Payer: Self-pay

## 2018-04-03 ENCOUNTER — Ambulatory Visit (INDEPENDENT_AMBULATORY_CARE_PROVIDER_SITE_OTHER): Payer: Medicaid Other | Admitting: Family Medicine

## 2018-04-03 ENCOUNTER — Encounter: Payer: Self-pay | Admitting: Family Medicine

## 2018-04-03 VITALS — BP 128/64 | HR 78 | Temp 99.0°F | Resp 14 | Ht 65.0 in | Wt 132.0 lb

## 2018-04-03 DIAGNOSIS — F1910 Other psychoactive substance abuse, uncomplicated: Secondary | ICD-10-CM | POA: Diagnosis not present

## 2018-04-03 DIAGNOSIS — F319 Bipolar disorder, unspecified: Secondary | ICD-10-CM | POA: Diagnosis not present

## 2018-04-03 DIAGNOSIS — F411 Generalized anxiety disorder: Secondary | ICD-10-CM | POA: Diagnosis not present

## 2018-04-03 MED ORDER — PRENATAL 27-0.8 MG PO TABS: 1.0000 | ORAL_TABLET | Freq: Every day | ORAL | 0 refills | Status: DC

## 2018-04-03 NOTE — Patient Instructions (Addendum)
Referral to psychotherapy  Continue zoloft F/U End of November ``1111
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
## 2018-04-03 NOTE — Assessment & Plan Note (Signed)
Unfortunate situation she continues to relapse into her substance abuse when her depression gets a hold of her.  She also has underlying anxiety.  She is overdose multiple times.  It seems that she does want to keep her child safe and is willing to take the Suboxone she does not want to be on methadone.  She is also recently started on Zoloft but will likely need more than 25 mg but this just got in her system.  She is now follow-up with her GYN for this.  I will arrange for therapist she is interested in going to therapy and following a strict regimen she can have some accountability.  She is going to complete her classes for substance abuse at the [facility] as mandated by DSS but she was actually continue on and they told her that she can so that she can continue to have weekly drug screens which will motivate her to stay clean.  She understands that if she does relapse that she is going to lose custody of her unborn child as well as her other children and there is also the possibility that she may harm or cause death to her unborn child.  She states that her DSS worker she does not know the name of the phone #[phone]

## 2018-04-11 MED FILL — SUBOXONE 8 MG-2 MG SL FILM: 8-2 | 28 days supply | Qty: 84 | Fill #0

## 2018-04-24 ENCOUNTER — Encounter (HOSPITAL_COMMUNITY): Payer: Self-pay

## 2018-04-24 NOTE — Progress Notes (Signed)
NICU NAS Tour scheduled for April 30, 2018 at 1:00.

## 2018-05-09 MED FILL — SUBOXONE 8 MG-2 MG SL FILM: 8-2 | 28 days supply | Qty: 84 | Fill #0

## 2018-06-06 MED FILL — SUBOXONE 8 MG-2 MG SL FILM: 8-2 | 28 days supply | Qty: 84 | Fill #0

## 2018-07-04 MED FILL — SUBOXONE 8 MG-2 MG SL FILM: 8-2 | 28 days supply | Qty: 84 | Fill #0

## 2018-07-09 ENCOUNTER — Ambulatory Visit: Payer: Medicaid Other | Admitting: Family Medicine

## 2018-07-16 ENCOUNTER — Other Ambulatory Visit: Payer: Medicaid Other

## 2018-07-16 DIAGNOSIS — Z20818 Contact with and (suspected) exposure to other bacterial communicable diseases: Secondary | ICD-10-CM

## 2018-07-16 LAB — OB RESULTS CONSOLE GBS: GBS: NEGATIVE

## 2018-07-19 LAB — BORDETELLA PERTUSSIS PCR
B. PERTUSSIS DNA: NOT DETECTED
B. parapertussis DNA: NOT DETECTED

## 2018-07-23 ENCOUNTER — Encounter (HOSPITAL_COMMUNITY): Payer: Self-pay | Admitting: *Deleted

## 2018-07-23 ENCOUNTER — Telehealth (HOSPITAL_COMMUNITY): Payer: Self-pay | Admitting: *Deleted

## 2018-07-23 NOTE — Telephone Encounter (Signed)
Preadmission screen  

## 2018-07-24 ENCOUNTER — Encounter: Payer: Self-pay | Admitting: Family Medicine

## 2018-08-01 MED FILL — SUBOXONE 8 MG-2 MG SL FILM: 8-2 | 28 days supply | Qty: 84 | Fill #0

## 2018-08-02 ENCOUNTER — Inpatient Hospital Stay (HOSPITAL_COMMUNITY)
Admission: AD | Admit: 2018-08-02 | Discharge: 2018-08-05 | DRG: 797 | Disposition: A | Discharge status: Home / Self Care | Payer: Medicaid Other | Attending: Obstetrics and Gynecology | Admitting: Obstetrics and Gynecology

## 2018-08-02 ENCOUNTER — Encounter (HOSPITAL_COMMUNITY): Payer: Self-pay | Admitting: *Deleted

## 2018-08-02 ENCOUNTER — Inpatient Hospital Stay (HOSPITAL_COMMUNITY): Payer: Medicaid Other | Admitting: Anesthesiology

## 2018-08-02 ENCOUNTER — Inpatient Hospital Stay (HOSPITAL_COMMUNITY)
Admission: RE | Admit: 2018-08-02 | Discharge: 2018-08-02 | Disposition: A | Discharge status: Home / Self Care | Payer: Medicaid Other | Source: Ambulatory Visit | Attending: Obstetrics and Gynecology | Admitting: Obstetrics and Gynecology

## 2018-08-02 ENCOUNTER — Other Ambulatory Visit: Payer: Self-pay

## 2018-08-02 DIAGNOSIS — O99334 Smoking (tobacco) complicating childbirth: Secondary | ICD-10-CM | POA: Diagnosis present

## 2018-08-02 DIAGNOSIS — F319 Bipolar disorder, unspecified: Secondary | ICD-10-CM | POA: Diagnosis present

## 2018-08-02 DIAGNOSIS — Z302 Encounter for sterilization: Secondary | ICD-10-CM

## 2018-08-02 DIAGNOSIS — O99324 Drug use complicating childbirth: Secondary | ICD-10-CM | POA: Diagnosis present

## 2018-08-02 DIAGNOSIS — O99344 Other mental disorders complicating childbirth: Secondary | ICD-10-CM | POA: Diagnosis present

## 2018-08-02 DIAGNOSIS — O26893 Other specified pregnancy related conditions, third trimester: Secondary | ICD-10-CM | POA: Diagnosis present

## 2018-08-02 DIAGNOSIS — F1110 Opioid abuse, uncomplicated: Secondary | ICD-10-CM | POA: Diagnosis present

## 2018-08-02 DIAGNOSIS — Z23 Encounter for immunization: Secondary | ICD-10-CM

## 2018-08-02 DIAGNOSIS — F17210 Nicotine dependence, cigarettes, uncomplicated: Secondary | ICD-10-CM | POA: Diagnosis present

## 2018-08-02 DIAGNOSIS — Z3A39 39 weeks gestation of pregnancy: Secondary | ICD-10-CM

## 2018-08-02 DIAGNOSIS — F411 Generalized anxiety disorder: Secondary | ICD-10-CM

## 2018-08-02 LAB — CBC
HCT: 35.6 % — ABNORMAL LOW (ref 36.0–46.0)
Hemoglobin: 11.7 g/dL — ABNORMAL LOW (ref 12.0–15.0)
MCH: 30.4 pg (ref 26.0–34.0)
MCHC: 32.9 g/dL (ref 30.0–36.0)
MCV: 92.5 fL (ref 80.0–100.0)
Platelets: 312 10*3/uL (ref 150–400)
RBC: 3.85 MIL/uL — ABNORMAL LOW (ref 3.87–5.11)
RDW: 13 % (ref 11.5–15.5)
WBC: 14 10*3/uL — ABNORMAL HIGH (ref 4.0–10.5)
nRBC: 0 % (ref 0.0–0.2)

## 2018-08-02 LAB — TYPE AND SCREEN
ABO/RH(D): B POS
Antibody Screen: NEGATIVE

## 2018-08-02 MED ORDER — BUTORPHANOL TARTRATE 1 MG/ML IJ SOLN: 1.0000 mg | INTRAMUSCULAR | Status: DC | PRN

## 2018-08-02 MED ORDER — OXYCODONE-ACETAMINOPHEN 5-325 MG PO TABS: 1.0000 | ORAL_TABLET | ORAL | Status: DC | PRN

## 2018-08-02 MED ORDER — LACTATED RINGERS IV SOLN
500.0000 mL | Freq: Once | INTRAVENOUS | Status: AC
  Administered 2018-08-02: 500 mL via INTRAVENOUS

## 2018-08-02 MED ORDER — OXYCODONE-ACETAMINOPHEN 5-325 MG PO TABS: 2.0000 | ORAL_TABLET | ORAL | Status: DC | PRN

## 2018-08-02 MED ORDER — PHENYLEPHRINE 40 MCG/ML (10ML) SYRINGE FOR IV PUSH (FOR BLOOD PRESSURE SUPPORT): 80.0000 ug | PREFILLED_SYRINGE | INTRAVENOUS | Status: DC | PRN

## 2018-08-02 MED ORDER — ONDANSETRON HCL 4 MG/2ML IJ SOLN: 4.0000 mg | Freq: Four times a day (QID) | INTRAMUSCULAR | Status: DC | PRN

## 2018-08-02 MED ORDER — OXYTOCIN BOLUS FROM INFUSION
500.0000 mL | Freq: Once | INTRAVENOUS | Status: AC
  Administered 2018-08-03: 500 mL via INTRAVENOUS

## 2018-08-02 MED ORDER — PHENYLEPHRINE 40 MCG/ML (10ML) SYRINGE FOR IV PUSH (FOR BLOOD PRESSURE SUPPORT)
80.0000 ug | PREFILLED_SYRINGE | INTRAVENOUS | Status: DC | PRN
  Filled 2018-08-02: qty 10

## 2018-08-02 MED ORDER — LACTATED RINGERS IV SOLN: INTRAVENOUS | Status: DC

## 2018-08-02 MED ORDER — LACTATED RINGERS AMNIOINFUSION
INTRAVENOUS | Status: DC
  Administered 2018-08-02: 250 mL via INTRAUTERINE

## 2018-08-02 MED ORDER — TERBUTALINE SULFATE 1 MG/ML IJ SOLN: 0.2500 mg | Freq: Once | INTRAMUSCULAR | Status: DC | PRN

## 2018-08-02 MED ORDER — EPHEDRINE 5 MG/ML INJ: 10.0000 mg | INTRAVENOUS | Status: DC | PRN

## 2018-08-02 MED ORDER — OXYTOCIN 40 UNITS IN LACTATED RINGERS INFUSION - SIMPLE MED
1.0000 m[IU]/min | INTRAVENOUS | Status: DC
  Administered 2018-08-02: 2 m[IU]/min via INTRAVENOUS
  Administered 2018-08-02: 4 m[IU]/min via INTRAVENOUS
  Filled 2018-08-02: qty 1000

## 2018-08-02 MED ORDER — FENTANYL 2.5 MCG/ML BUPIVACAINE 1/10 % EPIDURAL INFUSION (WH - ANES)
14.0000 mL/h | INTRAMUSCULAR | Status: DC | PRN
  Administered 2018-08-02: 14 mL/h via EPIDURAL
  Filled 2018-08-02: qty 100

## 2018-08-02 MED ORDER — ACETAMINOPHEN 325 MG PO TABS: 650.0000 mg | ORAL_TABLET | ORAL | Status: DC | PRN

## 2018-08-02 MED ORDER — LACTATED RINGERS IV SOLN: 500.0000 mL | Freq: Once | INTRAVENOUS | Status: DC

## 2018-08-02 MED ORDER — LACTATED RINGERS IV SOLN
500.0000 mL | INTRAVENOUS | Status: DC | PRN
  Administered 2018-08-02: 500 mL via INTRAVENOUS

## 2018-08-02 MED ORDER — DIPHENHYDRAMINE HCL 50 MG/ML IJ SOLN: 12.5000 mg | INTRAMUSCULAR | Status: DC | PRN

## 2018-08-02 MED ORDER — LIDOCAINE HCL (PF) 1 % IJ SOLN: 30.0000 mL | INTRAMUSCULAR | Status: DC | PRN

## 2018-08-02 MED ORDER — OXYTOCIN 40 UNITS IN LACTATED RINGERS INFUSION - SIMPLE MED
2.5000 [IU]/h | INTRAVENOUS | Status: DC
  Administered 2018-08-03: 2.5 [IU]/h via INTRAVENOUS

## 2018-08-02 MED ORDER — SOD CITRATE-CITRIC ACID 500-334 MG/5ML PO SOLN: 30.0000 mL | ORAL | Status: DC | PRN

## 2018-08-02 MED ORDER — LIDOCAINE HCL (PF) 1 % IJ SOLN
INTRAMUSCULAR | Status: DC | PRN
  Administered 2018-08-02: 5 mL via EPIDURAL

## 2018-08-02 NOTE — Progress Notes (Signed)
Comfortable with epidural Afeb, VSS FHT- 110-120, Cat I, ctx q 2-3 min VE-3/50/-2, vtx, AROM clear Continue pitocin, monitor progress

## 2018-08-02 NOTE — Anesthesia Preprocedure Evaluation (Signed)
Anesthesia Evaluation  Patient identified by MRN, date of birth, ID band Patient awake    Reviewed: Allergy & Precautions, Patient's Chart, lab work & pertinent test results  Airway Mallampati: I       Dental no notable dental hx.    Pulmonary Current Smoker,    Pulmonary exam normal        Cardiovascular negative cardio ROS Normal cardiovascular exam     Neuro/Psych Anxiety Depression Bipolar Disorder    GI/Hepatic Neg liver ROS,   Endo/Other    Renal/GU negative Renal ROS     Musculoskeletal   Abdominal   Peds  Hematology   Anesthesia Other Findings   Reproductive/Obstetrics (+) Pregnancy                             Anesthesia Physical Anesthesia Plan  ASA: II  Anesthesia Plan: Epidural   Post-op Pain Management:    Induction:   PONV Risk Score and Plan:   Airway Management Planned: Natural Airway  Additional Equipment: None  Intra-op Plan:   Post-operative Plan:   Informed Consent: I have reviewed the patients History and Physical, chart, labs and discussed the procedure including the risks, benefits and alternatives for the proposed anesthesia with the patient or authorized representative who has indicated his/her understanding and acceptance.     Plan Discussed with:   Anesthesia Plan Comments: (Lab Results      Component                Value               Date                      WBC                      14.0 (H)            08/02/2018                HGB                      11.7 (L)            08/02/2018                HCT                      35.6 (L)            08/02/2018                MCV                      92.5                08/02/2018                PLT                      312                 08/02/2018           )        Anesthesia Quick Evaluation

## 2018-08-02 NOTE — H&P (Signed)
Chesley MiresBrittany G Ayler is a 27 y.o. female, G5 45P2022, [facility] [redacted] [facility] for elective induction.  Prenatal care complicated by h/o substance abuse, on Suboxone during pregnancy, did have a brief relapse during pregnancy.  Also on zoloft for h/o bipolar.  OB History    Gravida  5   Para  2   Term  2   Preterm  0   AB  2   Living  2     SAB  1   TAB  1   Ectopic  0   Multiple  0   Live Births  2          Past Medical History:  Diagnosis Date  . Abortion   . ADHD (attention deficit hyperactivity disorder)   . Anxiety   . Bipolar 1 disorder (HCC)   . Depression   . Hematuria 10/11/2015  . Pain with urination 10/11/2015  . Substance abuse (HCC)   . Vaginal discharge 10/11/2015   Past Surgical History:  Procedure Laterality Date  . INDUCED ABORTION    . WISDOM TOOTH EXTRACTION     Family History: family history includes Asthma in her maternal grandmother; COPD in her maternal grandmother; Hypertension in her father. Social History:  reports that she has been smoking cigarettes. She has a 2.00 pack-year smoking history. She has never used smokeless tobacco. She reports current drug use. Drugs: Heroin and "Crack" cocaine. She reports that she does not drink alcohol.     Maternal Diabetes: No Genetic Screening: Normal Maternal Ultrasounds/Referrals: Normal Fetal Ultrasounds or other Referrals:  None Maternal Substance Abuse:  Yes:  Type: Other:  Significant Maternal Medications:  Meds include: Zoloft Significant Maternal Lab Results:  Lab values include: Group B Strep negative Other Comments:  Suboxone, did briefly use opioids and cocaine during pregnancy  Review of Systems  Respiratory: Negative.   Cardiovascular: Negative.    Maternal Medical History:  Fetal activity: Perceived fetal activity is normal.    Prenatal Complications - Diabetes: none.      Height 5\' 5"  (1.651 m), weight 63.9 kg, last menstrual period 10/22/2017. Maternal  Exam:  Uterine Assessment: Contraction strength is mild.  Contraction frequency is rare.   Abdomen: Patient reports no abdominal tenderness. Estimated fetal weight is 7 lbs.   Fetal presentation: vertex  Introitus: Normal vulva. Normal vagina.  Amniotic fluid character: not assessed.  Pelvis: adequate for delivery.      Fetal Exam Fetal Monitor Review: Mode: ultrasound.   Baseline rate: 120.  Variability: moderate (6-25 bpm).   Pattern: accelerations present and no decelerations.    Fetal State Assessment: Category I - tracings are normal.     Physical Exam  Vitals reviewed. Constitutional: She appears well-developed and well-nourished.  Cardiovascular: Normal rate and regular rhythm.  Respiratory: Effort normal. No respiratory distress.  GI: Soft.  Genitourinary:    Vulva normal.     Prenatal labs: ABO, Rh: --/--/B POS (05/08 2354) Antibody:  neg Rubella: Immune (06/17 0000) RPR: Nonreactive (06/17 0000)  HBsAg: Negative (06/17 0000)  HIV: Non-reactive (06/17 0000)  GBS: Negative (12/03 0000)   Assessment/Plan: IUP at 39 weeks, h/o substance abuse on suboxone, for elective induction.  Will start pitocin, monitor progress, discussed potential risks of pitocin.   Leighton Roachodd D Yaretzi Ernandez 08/02/2018, 4:35 PM

## 2018-08-02 NOTE — Progress Notes (Signed)
Comfortable Afeb, VSS FHT-120, mod variability, + accels and scalp stim, variable decels, ctx q 2-3 min VE-4-5/80/-1, vtx, IUPC and FSE palced Pitocin is off due to variable decels, will try amnioinfusion and monitor FHT and ctx and see if needs pitocin restarted

## 2018-08-02 NOTE — Anesthesia Procedure Notes (Signed)
Epidural Patient location during procedure: OB Start time: 08/02/2018 7:44 PM End time: 08/02/2018 7:51 PM  Staffing Anesthesiologist: Shelton SilvasHollis, Vikki Gains D, MD Performed: anesthesiologist   Preanesthetic Checklist Completed: patient identified, site marked, surgical consent, pre-op evaluation, timeout performed, IV checked, risks and benefits discussed and monitors and equipment checked  Epidural Patient position: sitting Prep: ChloraPrep Patient monitoring: heart rate, continuous pulse ox and blood pressure Approach: midline Location: L3-L4 Injection technique: LOR saline  Needle:  Needle type: Tuohy  Needle gauge: 17 G Needle length: 9 cm Catheter type: closed end flexible Catheter size: 20 Guage Test dose: negative and 1.5% lidocaine  Assessment Events: blood not aspirated, injection not painful, no injection resistance and no paresthesia  Additional Notes LOR @ 4.5  Patient identified. Risks/Benefits/Options discussed with patient including but not limited to bleeding, infection, nerve damage, paralysis, failed block, incomplete pain control, headache, blood pressure changes, nausea, vomiting, reactions to medications, itching and postpartum back pain. Confirmed with bedside nurse the patient's most recent platelet count. Confirmed with patient that they are not currently taking any anticoagulation, have any bleeding history or any family history of bleeding disorders. Patient expressed understanding and wished to proceed. All questions were answered. Sterile technique was used throughout the entire procedure. Please see nursing notes for vital signs. Test dose was given through epidural catheter and negative prior to continuing to dose epidural or start infusion. Warning signs of high block given to the patient including shortness of breath, tingling/numbness in hands, complete motor block, or any concerning symptoms with instructions to call for help. Patient was given instructions  on fall risk and not to get out of bed. All questions and concerns addressed with instructions to call with any issues or inadequate analgesia.    Reason for block:procedure for pain

## 2018-08-03 ENCOUNTER — Encounter (HOSPITAL_COMMUNITY): Payer: Self-pay

## 2018-08-03 LAB — RPR: RPR Ser Ql: NONREACTIVE

## 2018-08-03 MED ORDER — PNEUMOCOCCAL VAC POLYVALENT 25 MCG/0.5ML IJ INJ
0.5000 mL | INJECTION | INTRAMUSCULAR | Status: AC
  Administered 2018-08-05: 0.5 mL via INTRAMUSCULAR
  Filled 2018-08-03 (×2): qty 0.5

## 2018-08-03 MED ORDER — BENZOCAINE-MENTHOL 20-0.5 % EX AERO
1.0000 "application " | INHALATION_SPRAY | CUTANEOUS | Status: DC | PRN
  Administered 2018-08-03: 1 via TOPICAL
  Filled 2018-08-03 (×2): qty 56

## 2018-08-03 MED ORDER — ONDANSETRON HCL 4 MG PO TABS: 4.0000 mg | ORAL_TABLET | ORAL | Status: DC | PRN

## 2018-08-03 MED ORDER — SERTRALINE HCL 25 MG PO TABS
25.0000 mg | ORAL_TABLET | Freq: Every day | ORAL | Status: DC
  Filled 2018-08-03: qty 1

## 2018-08-03 MED ORDER — DIPHENHYDRAMINE HCL 25 MG PO CAPS: 25.0000 mg | ORAL_CAPSULE | Freq: Four times a day (QID) | ORAL | Status: DC | PRN

## 2018-08-03 MED ORDER — ZOLPIDEM TARTRATE 5 MG PO TABS: 5.0000 mg | ORAL_TABLET | Freq: Every evening | ORAL | Status: DC | PRN

## 2018-08-03 MED ORDER — LACTATED RINGERS AMNIOINFUSION
Freq: Once | INTRAVENOUS | Status: AC
  Administered 2018-08-03: 250 mL/h via INTRAUTERINE

## 2018-08-03 MED ORDER — COCONUT OIL OIL: 1.0000 "application " | TOPICAL_OIL | Status: DC | PRN

## 2018-08-03 MED ORDER — MAGNESIUM HYDROXIDE 400 MG/5ML PO SUSP: 30.0000 mL | ORAL | Status: DC | PRN

## 2018-08-03 MED ORDER — MEASLES, MUMPS & RUBELLA VAC IJ SOLR
0.5000 mL | Freq: Once | INTRAMUSCULAR | Status: DC
  Filled 2018-08-03: qty 0.5

## 2018-08-03 MED ORDER — OXYCODONE HCL 5 MG PO TABS: 5.0000 mg | ORAL_TABLET | ORAL | Status: DC | PRN

## 2018-08-03 MED ORDER — DIBUCAINE 1 % RE OINT
1.0000 "application " | TOPICAL_OINTMENT | RECTAL | Status: DC | PRN
  Filled 2018-08-03: qty 28

## 2018-08-03 MED ORDER — FAMOTIDINE 20 MG PO TABS: 40.0000 mg | ORAL_TABLET | Freq: Once | ORAL | Status: DC

## 2018-08-03 MED ORDER — ACETAMINOPHEN 325 MG PO TABS
650.0000 mg | ORAL_TABLET | ORAL | Status: DC | PRN
  Administered 2018-08-03 – 2018-08-04 (×3): 650 mg via ORAL
  Filled 2018-08-03 (×3): qty 2

## 2018-08-03 MED ORDER — PRENATAL MULTIVITAMIN CH
1.0000 | ORAL_TABLET | Freq: Every day | ORAL | Status: DC
  Administered 2018-08-03 – 2018-08-05 (×2): 1 via ORAL
  Filled 2018-08-03 (×2): qty 1

## 2018-08-03 MED ORDER — BUPRENORPHINE HCL 8 MG SL SUBL: 8.0000 mg | SUBLINGUAL_TABLET | Freq: Three times a day (TID) | SUBLINGUAL | Status: DC

## 2018-08-03 MED ORDER — BUPRENORPHINE HCL-NALOXONE HCL 8-2 MG SL SUBL: 1.0000 | SUBLINGUAL_TABLET | Freq: Three times a day (TID) | SUBLINGUAL | Status: DC

## 2018-08-03 MED ORDER — LACTATED RINGERS IV SOLN
INTRAVENOUS | Status: DC
  Administered 2018-08-04: 09:00:00 via INTRAVENOUS

## 2018-08-03 MED ORDER — WITCH HAZEL-GLYCERIN EX PADS: 1.0000 "application " | MEDICATED_PAD | CUTANEOUS | Status: DC | PRN

## 2018-08-03 MED ORDER — METHYLERGONOVINE MALEATE 0.2 MG/ML IJ SOLN: 0.2000 mg | INTRAMUSCULAR | Status: DC | PRN

## 2018-08-03 MED ORDER — SERTRALINE HCL 100 MG PO TABS
100.0000 mg | ORAL_TABLET | Freq: Every day | ORAL | Status: DC
  Administered 2018-08-03 – 2018-08-05 (×3): 100 mg via ORAL
  Filled 2018-08-03 (×5): qty 1

## 2018-08-03 MED ORDER — SIMETHICONE 80 MG PO CHEW: 80.0000 mg | CHEWABLE_TABLET | ORAL | Status: DC | PRN

## 2018-08-03 MED ORDER — METHYLERGONOVINE MALEATE 0.2 MG PO TABS: 0.2000 mg | ORAL_TABLET | ORAL | Status: DC | PRN

## 2018-08-03 MED ORDER — HYDROXYZINE HCL 50 MG PO TABS
50.0000 mg | ORAL_TABLET | Freq: Four times a day (QID) | ORAL | Status: DC | PRN
  Administered 2018-08-03: 50 mg via ORAL
  Filled 2018-08-03 (×2): qty 1

## 2018-08-03 MED ORDER — SENNOSIDES-DOCUSATE SODIUM 8.6-50 MG PO TABS
2.0000 | ORAL_TABLET | ORAL | Status: DC
  Administered 2018-08-03 – 2018-08-05 (×2): 2 via ORAL
  Filled 2018-08-03 (×2): qty 2

## 2018-08-03 MED ORDER — METOCLOPRAMIDE HCL 10 MG PO TABS: 10.0000 mg | ORAL_TABLET | Freq: Once | ORAL | Status: DC

## 2018-08-03 MED ORDER — OXYCODONE HCL 5 MG PO TABS: 10.0000 mg | ORAL_TABLET | ORAL | Status: DC | PRN

## 2018-08-03 MED ORDER — TETANUS-DIPHTH-ACELL PERTUSSIS 5-2.5-18.5 LF-MCG/0.5 IM SUSP: 0.5000 mL | Freq: Once | INTRAMUSCULAR | Status: DC

## 2018-08-03 MED ORDER — ONDANSETRON HCL 4 MG/2ML IJ SOLN: 4.0000 mg | INTRAMUSCULAR | Status: DC | PRN

## 2018-08-03 MED ORDER — IBUPROFEN 600 MG PO TABS
600.0000 mg | ORAL_TABLET | Freq: Four times a day (QID) | ORAL | Status: DC
  Administered 2018-08-03 – 2018-08-05 (×10): 600 mg via ORAL
  Filled 2018-08-03 (×9): qty 1

## 2018-08-03 MED ORDER — BUPRENORPHINE HCL 2 MG SL SUBL
2.0000 mg | SUBLINGUAL_TABLET | Freq: Once | SUBLINGUAL | Status: AC
  Administered 2018-08-03: 2 mg via SUBLINGUAL

## 2018-08-03 MED ORDER — BUPRENORPHINE HCL 2 MG SL SUBL
2.0000 mg | SUBLINGUAL_TABLET | Freq: Every day | SUBLINGUAL | Status: DC
  Administered 2018-08-03 – 2018-08-05 (×3): 2 mg via SUBLINGUAL
  Filled 2018-08-03 (×4): qty 1

## 2018-08-03 NOTE — Progress Notes (Signed)
PPD #0 Doing ok, wants BTL Afeb, VSS Fundus firm BTL has been bumped, will let her eat today and to BTL tomorrow at 0900.  Had held subutex for surgery, will go ahead and give now

## 2018-08-03 NOTE — Plan of Care (Signed)
  Problem: Education: Goal: Knowledge of Childbirth will improve Outcome: Completed/Met Goal: Ability to make informed decisions regarding treatment and plan of care will improve Outcome: Completed/Met Goal: Ability to state and carry out methods to decrease the pain will improve Outcome: Completed/Met   Problem: Coping: Goal: Ability to verbalize concerns and feelings about labor and delivery will improve Outcome: Completed/Met   Problem: Life Cycle: Goal: Ability to make normal progression through stages of labor will improve Outcome: Completed/Met Goal: Ability to effectively push during vaginal delivery will improve Outcome: Completed/Met   Problem: Role Relationship: Goal: Ability to demonstrate positive interaction with the child will improve Outcome: Completed/Met   Problem: Safety: Goal: Risk of complications during labor and delivery will decrease Outcome: Completed/Met   Problem: Pain Management: Goal: Relief or control of pain from uterine contractions will improve Outcome: Completed/Met

## 2018-08-03 NOTE — Lactation Note (Signed)
This note was copied from a baby's chart. Lactation Consultation Note  Patient Name: Kimberly Barrera ZOXWR'UToday's Date: 08/03/2018 Reason for consult: Initial assessment;Other (Comment);Term(11 hours old / mom on hx of drug use on Subutex and expected D/C per Pedis  in 5 days/ baby UDS - Neg / mom desires to pump and bottle feed )  LC reviewed supply and demand and the importance of pumping both breast for 15 -20 mins 8- 10 times in 24 hours.  Per mom has only pumped x 3  With EBM yield . LC praised her for her efforts and encouraged to increase.  Mother informed of post-discharge support and given phone number to the lactation department, including services for phone call assistance; out-patient appointments; and breastfeeding support group. List of other breastfeeding resources in the community given in the handout. Encouraged mother to call for problems or concerns related to breastfeeding.   Maternal Data Has patient been taught Hand Expression?: Yes(per mom feels comfortable - LC enc prior to pumping and after to enhance milk supply . )  Feeding Feeding Type: Breast Milk with Formula added  LATCH Score                   Interventions Interventions: Breast feeding basics reviewed;DEBP  Lactation Tools Discussed/Used Pump Review: Setup, frequency, and cleaning;Milk Storage(LC reviewed/ MAI ) Initiated by:: MAI reviewed  Date initiated:: 08/03/18   Consult Status Consult Status: Follow-up Date: 08/04/18 Follow-up type: In-patient    Matilde SprangMargaret Ann Damarys Speir 08/03/2018, 3:29 PM

## 2018-08-03 NOTE — Anesthesia Postprocedure Evaluation (Signed)
Anesthesia Post Note  Patient: Kimberly MiresBrittany G Barrera  Procedure(s) Performed: AN AD HOC LABOR EPIDURAL     Patient location during evaluation: Mother Baby Anesthesia Type: Epidural Level of consciousness: awake and alert Pain management: pain level controlled Vital Signs Assessment: post-procedure vital signs reviewed and stable Respiratory status: spontaneous breathing, nonlabored ventilation and respiratory function stable Cardiovascular status: stable Postop Assessment: no headache, no backache, epidural receding, no apparent nausea or vomiting, patient able to bend at knees, able to ambulate and adequate PO intake Anesthetic complications: no    Last Vitals:  Vitals:   08/03/18 0552 08/03/18 0653  BP: 116/75 106/66  Pulse: (!) 54 (!) 57  Resp: 18 18  Temp: 36.8 C 36.7 C  SpO2: 99%     Last Pain:  Vitals:   08/03/18 0653  TempSrc: Oral  PainSc:    Pain Goal:                 Laban EmperorMalinova,Preston Garabedian Hristova

## 2018-08-04 ENCOUNTER — Encounter (HOSPITAL_COMMUNITY)
Admission: AD | Disposition: A | Discharge status: Home / Self Care | Payer: Self-pay | Source: Home / Self Care | Attending: Obstetrics and Gynecology

## 2018-08-04 ENCOUNTER — Inpatient Hospital Stay (HOSPITAL_COMMUNITY): Payer: Medicaid Other | Admitting: Anesthesiology

## 2018-08-04 ENCOUNTER — Encounter (HOSPITAL_COMMUNITY): Payer: Self-pay | Admitting: Anesthesiology

## 2018-08-04 HISTORY — PX: TUBAL LIGATION: SHX77

## 2018-08-04 LAB — CBC
HCT: 31.5 % — ABNORMAL LOW (ref 36.0–46.0)
Hemoglobin: 10.4 g/dL — ABNORMAL LOW (ref 12.0–15.0)
MCH: 31 pg (ref 26.0–34.0)
MCHC: 33 g/dL (ref 30.0–36.0)
MCV: 93.8 fL (ref 80.0–100.0)
Platelets: 278 10*3/uL (ref 150–400)
RBC: 3.36 MIL/uL — ABNORMAL LOW (ref 3.87–5.11)
RDW: 13.1 % (ref 11.5–15.5)
WBC: 14.7 10*3/uL — ABNORMAL HIGH (ref 4.0–10.5)
nRBC: 0 % (ref 0.0–0.2)

## 2018-08-04 SURGERY — LIGATION, FALLOPIAN TUBE, POSTPARTUM
Anesthesia: Epidural

## 2018-08-04 MED ORDER — NALOXONE HCL 4 MG/10ML IJ SOLN
1.0000 ug/kg/h | INTRAVENOUS | Status: DC | PRN
  Filled 2018-08-04: qty 5

## 2018-08-04 MED ORDER — MEPERIDINE HCL 25 MG/ML IJ SOLN: 6.2500 mg | INTRAMUSCULAR | Status: DC | PRN

## 2018-08-04 MED ORDER — NALBUPHINE HCL 10 MG/ML IJ SOLN: 5.0000 mg | INTRAMUSCULAR | Status: DC | PRN

## 2018-08-04 MED ORDER — FENTANYL CITRATE (PF) 100 MCG/2ML IJ SOLN
INTRAMUSCULAR | Status: AC
  Filled 2018-08-04: qty 2

## 2018-08-04 MED ORDER — SCOPOLAMINE 1 MG/3DAYS TD PT72: 1.0000 | MEDICATED_PATCH | Freq: Once | TRANSDERMAL | Status: DC

## 2018-08-04 MED ORDER — MIDAZOLAM HCL 2 MG/2ML IJ SOLN
INTRAMUSCULAR | Status: AC
  Filled 2018-08-04: qty 2

## 2018-08-04 MED ORDER — NALBUPHINE HCL 10 MG/ML IJ SOLN: 5.0000 mg | Freq: Once | INTRAMUSCULAR | Status: DC | PRN

## 2018-08-04 MED ORDER — KETOROLAC TROMETHAMINE 30 MG/ML IJ SOLN: 30.0000 mg | Freq: Four times a day (QID) | INTRAMUSCULAR | Status: AC | PRN

## 2018-08-04 MED ORDER — BUPIVACAINE HCL (PF) 0.25 % IJ SOLN
INTRAMUSCULAR | Status: AC
  Filled 2018-08-04: qty 30

## 2018-08-04 MED ORDER — ONDANSETRON HCL 4 MG/2ML IJ SOLN
INTRAMUSCULAR | Status: AC
  Filled 2018-08-04: qty 2

## 2018-08-04 MED ORDER — MIDAZOLAM HCL 2 MG/2ML IJ SOLN
INTRAMUSCULAR | Status: DC | PRN
  Administered 2018-08-04: 2 mg via INTRAVENOUS

## 2018-08-04 MED ORDER — SODIUM CHLORIDE 0.9% FLUSH: 3.0000 mL | INTRAVENOUS | Status: DC | PRN

## 2018-08-04 MED ORDER — BUPIVACAINE IN DEXTROSE 0.75-8.25 % IT SOLN
INTRATHECAL | Status: DC | PRN
  Administered 2018-08-04: 1.5 mL via INTRATHECAL

## 2018-08-04 MED ORDER — ONDANSETRON HCL 4 MG/2ML IJ SOLN: 4.0000 mg | Freq: Three times a day (TID) | INTRAMUSCULAR | Status: DC | PRN

## 2018-08-04 MED ORDER — NALOXONE HCL 0.4 MG/ML IJ SOLN: 0.4000 mg | INTRAMUSCULAR | Status: DC | PRN

## 2018-08-04 MED ORDER — KETOROLAC TROMETHAMINE 30 MG/ML IJ SOLN
INTRAMUSCULAR | Status: AC
  Filled 2018-08-04: qty 1

## 2018-08-04 MED ORDER — PROMETHAZINE HCL 25 MG/ML IJ SOLN: 6.2500 mg | INTRAMUSCULAR | Status: DC | PRN

## 2018-08-04 MED ORDER — SODIUM CHLORIDE (PF) 0.9 % IJ SOLN
INTRAMUSCULAR | Status: AC
  Filled 2018-08-04: qty 10

## 2018-08-04 MED ORDER — FENTANYL CITRATE (PF) 100 MCG/2ML IJ SOLN: 25.0000 ug | INTRAMUSCULAR | Status: DC | PRN

## 2018-08-04 MED ORDER — DEXAMETHASONE SODIUM PHOSPHATE 4 MG/ML IJ SOLN
INTRAMUSCULAR | Status: AC
  Filled 2018-08-04: qty 1

## 2018-08-04 MED ORDER — LACTATED RINGERS IV SOLN: INTRAVENOUS | Status: DC

## 2018-08-04 MED ORDER — KETOROLAC TROMETHAMINE 30 MG/ML IJ SOLN
INTRAMUSCULAR | Status: DC | PRN
  Administered 2018-08-04: 30 mg via INTRAVENOUS

## 2018-08-04 MED ORDER — FENTANYL CITRATE (PF) 100 MCG/2ML IJ SOLN
INTRAMUSCULAR | Status: DC | PRN
  Administered 2018-08-04: 15 ug via INTRAVENOUS

## 2018-08-04 MED ORDER — BUPIVACAINE HCL (PF) 0.25 % IJ SOLN
INTRAMUSCULAR | Status: DC | PRN
  Administered 2018-08-04: 10 mL

## 2018-08-04 MED ORDER — DEXAMETHASONE SODIUM PHOSPHATE 4 MG/ML IJ SOLN
INTRAMUSCULAR | Status: DC | PRN
  Administered 2018-08-04: 4 mg via INTRAVENOUS

## 2018-08-04 MED ORDER — DIPHENHYDRAMINE HCL 25 MG PO CAPS
25.0000 mg | ORAL_CAPSULE | ORAL | Status: DC | PRN
  Filled 2018-08-04: qty 1

## 2018-08-04 MED ORDER — DIPHENHYDRAMINE HCL 50 MG/ML IJ SOLN: 12.5000 mg | INTRAMUSCULAR | Status: DC | PRN

## 2018-08-04 MED ORDER — ONDANSETRON HCL 4 MG/2ML IJ SOLN
INTRAMUSCULAR | Status: DC | PRN
  Administered 2018-08-04: 4 mg via INTRAVENOUS

## 2018-08-04 SURGICAL SUPPLY — 23 items
BLADE SURG 11 STRL SS (BLADE) ×2 IMPLANT
CLOTH BEACON ORANGE TIMEOUT ST (SAFETY) ×2 IMPLANT
DRESSING OPSITE X SMALL 2X3 (GAUZE/BANDAGES/DRESSINGS) ×2 IMPLANT
DRSG OPSITE POSTOP 3X4 (GAUZE/BANDAGES/DRESSINGS) ×2 IMPLANT
DURAPREP 26ML APPLICATOR (WOUND CARE) ×2 IMPLANT
GLOVE BIO SURGEON STRL SZ8 (GLOVE) ×2 IMPLANT
GLOVE BIOGEL PI IND STRL 7.0 (GLOVE) ×1 IMPLANT
GLOVE BIOGEL PI INDICATOR 7.0 (GLOVE) ×1
GLOVE ORTHO TXT STRL SZ7.5 (GLOVE) ×2 IMPLANT
GOWN STRL REUS W/TWL LRG LVL3 (GOWN DISPOSABLE) ×4 IMPLANT
NEEDLE HYPO 22GX1.5 SAFETY (NEEDLE) ×2 IMPLANT
NS IRRIG 1000ML POUR BTL (IV SOLUTION) ×2 IMPLANT
PACK ABDOMINAL MINOR (CUSTOM PROCEDURE TRAY) ×2 IMPLANT
PROTECTOR NERVE ULNAR (MISCELLANEOUS) ×2 IMPLANT
SPONGE LAP 4X18 RFD (DISPOSABLE) IMPLANT
SUT PLAIN 0 NONE (SUTURE) ×2 IMPLANT
SUT VIC AB 0 CT1 27 (SUTURE) ×1
SUT VIC AB 0 CT1 27XBRD ANBCTR (SUTURE) ×1 IMPLANT
SUT VICRYL RAPIDE 4/0 PS 2 (SUTURE) ×2 IMPLANT
SYR CONTROL 10ML LL (SYRINGE) ×2 IMPLANT
TOWEL OR 17X24 6PK STRL BLUE (TOWEL DISPOSABLE) ×4 IMPLANT
TRAY FOLEY CATH SILVER 14FR (SET/KITS/TRAYS/PACK) ×2 IMPLANT
WATER STERILE IRR 1000ML POUR (IV SOLUTION) ×2 IMPLANT

## 2018-08-04 NOTE — Clinical Social Work Maternal (Signed)
CLINICAL SOCIAL WORK MATERNAL/CHILD NOTE  Patient Details  Name: Kimberly Barrera MRN: [id number] Date of Birth: 11/21/1990  Date:  08/04/2018  Clinical Social Worker Initiating Note:  Karrington Studnicka Boyd-Gilyard Date/Time: Initiated:  08/04/18/1359     Child's Name:  Kimberly Barrera   Biological Parents:  Mother, Father   Need for Interpreter:  None   Reason for Referral:  Current Substance Use/Substance Use During Pregnancy , Behavioral Health Concerns(MOB is current taking suboxone. Hx of polysubstance use. )   Address:  [address] [place] [address]    Phone number:  [phone] (home)     Additional phone number:   Household Members/Support Persons (HM/SP):   Household Member/Support Person 1, Household Member/Support Person 2(MOB has two older children(Kimberly Barrera 07/02/11 and Kimberly Barrera 02/02/12) that she shares custody of. They currently lives with their father(Kimberly Barrera [phone] )in [place], [place])   HM/SP Name Relationship DOB or Age  HM/SP -1 Kimberly Barrera FOB 08/04/18  HM/SP -2 Kimberly Barrera stepdaughter 03/17/2005  HM/SP -3        HM/SP -4        HM/SP -5        HM/SP -6        HM/SP -7        HM/SP -8          Natural Supports (not living in the home):  Parent(Per MOB, FOB's family will also provide supports. )   Professional Supports: Therapist(MOB's therapist is Melissa from [facility].)   Employment: Unemployed   Type of Work:     Education:  9 to 11 years   Homebound arranged: No  Financial Resources:  Medicaid   Other Resources:  Food Stamps (MOB plans to apply for WIC.  )   Cultural/Religious Considerations Which May Impact Care:    Strengths:  Ability to meet basic needs , Compliance with medical plan , Home prepared for child , Psychotropic Medications, Understanding of illness, Pediatrician chosen   Psychotropic Medications:  Zoloft, Suboxone      Pediatrician:    [place] area  Pediatrician List:    [place]([facility])  [place]    [place]    [place]    [place]    [place]      Pediatrician Fax Number:    Risk Factors/Current Problems:  Substance Use , Mental Health Concerns    Cognitive State:  Alert , Able to Concentrate , Insightful , Linear Thinking , Goal Oriented    Mood/Affect:  Bright , Happy , Interested , Comfortable    CSW Assessment: CSW met with MOB in room 145 to complete an assessment for MH hx and SA hx  When CSW arrived, MOB was resting in bed and FOB was watching TV.  With MOB's permission, CSW asked FOB to leave the room in order for CSW to complete assessment in private. MOB was polite, forthcoming, and receptive to meeting with CSW.   CSW asked about MOB's SA hx and MOB openly shared her struggle with polysubstance.  MOB reported currently taking Suboxone and feeling like the medications are managing her "Cravings and withdrawal symptoms." CSW praised MOB for seeking help and congratulated her on her sobriety. It was evident by MOB's smile that MOB was also proud of herself for her sobriety. MOB communicated that she is able to recognize her addiction triggers and attributed her insight an awareness about her addiction to seeking individual counseling at the [facility] and MAT at [facility].   CSW informed MOB of the hospital's drug screen policy. MOB was made aware of the 2 drug screenings for the infant.  MOB was understanding and did not have any concerns.  CSW shared with MOB that the infant had a negative UDS, and CSW will monitor the infant's CDS and will make a report to [place] CPS if warranted. MOB communicated that MOB had an open CPS case July 2019 due to MOB's relapse and endangering herself.  MOB CPS case is currently closed.   CSW asked about MOB's MH hx and MOB reported a dx of anxiety, depression, and bipolar disorder.  Per MOB, MOB's symptoms are  currently being managed with medication (Zoloft).   CSW provided education regarding the baby blues period vs. perinatal mood disorders, discussed treatment and gave resources for mental health follow up if concerns arise.  CSW recommends self-evaluation during the postpartum time period using the New Mom Checklist from Postpartum Progress and encouraged MOB to contact a medical professional if symptoms are noted at any time.  MOB presented with insight and awareness and did not demonstrate any acute symptoms. CSW assessed for safety and MOB denied SI, HI, and DV. MOB reported having a good support team and feeling prepared to parent.   CSW provided review of Sudden Infant Death Syndrome (SIDS) precautions.   CSW identifies no further need for intervention and no barriers to discharge at this time.  CSW will continue to offer family resources and supports while infant remains in NICU.   CSW Plan/Description:  Psychosocial Support and Ongoing Assessment of Needs, Sudden Infant Death Syndrome (SIDS) Education, Perinatal Mood and Anxiety Disorder (PMADs) Education, Neonatal Abstinence Syndrome (NAS) Education, Other Patient/Family Education, Hospital Drug Screen Policy Information, Other Information/Referral to Community Resources, CSW Will Continue to Monitor Umbilical Cord Tissue Drug Screen Results and Make Report if Warranted   Rani Idler Boyd-Gilyard, MSW, LCSW Clinical Social Work [phone]   Dannae Kato D BOYD-GILYARD, LCSW 08/04/2018, 2:12 PM 

## 2018-08-04 NOTE — Progress Notes (Signed)
Pt would like pneumonia vaccine before discharge

## 2018-08-04 NOTE — Op Note (Signed)
Preoperative diagnosis: Postpartum, desires surgical sterility Postoperative diagnosis: Same Procedure: Postpartum bilateral salpingectomy Surgeon: Lyle Niblett M.D. Anesthesia: Epidural Findings: She had normal postpartum anatomy Estimated blood loss: Minimal Specimens: Portions of bilateral fallopian tubes Complications: None  Procedure in detail:  The patient was taken to the operating room placed in the dorsosupine position. Her previously placed epidural was dosed appropriately. Abdomen was then prepped and draped in the usual sterile fashion. The level of her anesthesia was found to be adequate. Infraumbilical skin was infiltrated with quarter percent Marcaine and a 3 cm horizontal incision was made. The fascia was identified and elevated. Fascia was entered sharply and this also entered the peritoneal cavity. Army-Nav[employer] retractors were then used to expose both fallopian tubes. Each fallopian tube was identified and traced to its fimbriated end. The middle portion of each fallopian tube was elevated with a Babcock clamp. A knuckle of tube on each side was then ligated with #0 plain gut suture and the knuckle of tube was removed sharply. On both sides both ostia were identified and the stumps were hemostatic. Fascia was then closed in running fashion with 0 Vicryl. Skin was closed with running subcuticular suture of 4 4-0 Vicryl followed by a sterile dressing. Patient tolerated the procedure well and was taken to the recovery in stable condition. Counts were correct and she had PAS hose on throughout the procedure. 

## 2018-08-04 NOTE — Anesthesia Postprocedure Evaluation (Signed)
Anesthesia Post Note  Patient: Kimberly Barrera  Procedure(s) Performed: POST PARTUM TUBAL LIGATION (N/A )     Patient location during evaluation: Mother Baby Anesthesia Type: Epidural Level of consciousness: oriented and awake and alert Pain management: pain level controlled Vital Signs Assessment: post-procedure vital signs reviewed and stable Respiratory status: spontaneous breathing and respiratory function stable Cardiovascular status: blood pressure returned to baseline and stable Postop Assessment: no headache, no backache and no apparent nausea or vomiting Anesthetic complications: no    Last Vitals:  Vitals:   08/04/18 1250 08/04/18 1600  BP: 115/60 119/79  Pulse: 76 (!) 58  Resp: 18 18  Temp: 37 C   SpO2: 99% 98%    Last Pain:  Vitals:   08/04/18 1602  TempSrc:   PainSc: 4    Pain Goal: Patients Stated Pain Goal: 3 (08/03/18 1800)               Junious SilkGILBERT,Malikhi Ogan

## 2018-08-04 NOTE — Transfer of Care (Signed)
Immediate Anesthesia Transfer of Care Note  Patient: Kimberly Barrera  Procedure(s) Performed: POST PARTUM TUBAL LIGATION (N/A )  Patient Location: PACU  Anesthesia Type:Spinal  Level of Consciousness: awake, alert  and oriented  Airway & Oxygen Therapy: Patient Spontanous Breathing  Post-op Assessment: Report given to RN and Post -op Vital signs reviewed and stable  Post vital signs: Reviewed and stable  Last Vitals:  Vitals Value Taken Time  BP    Temp    Pulse    Resp    SpO2      Last Pain:  Vitals:   08/04/18 0834  TempSrc: Oral  PainSc:       Patients Stated Pain Goal: 3 (08/03/18 1800)  Complications: No apparent anesthesia complications

## 2018-08-04 NOTE — Lactation Note (Signed)
This note was copied from a baby's chart. Lactation Consultation Note  Patient Name: Boy Collene GobbleBrittany Schoenfeldt ZOXWR'UToday's Date: 08/04/2018 Reason for consult: Follow-up assessment;Term;NICU baby  Visited with mom of a 4239 hour old NICU infant, mom is currently pumping and turning her breastmilk to her RN to be taken to her NICU baby. She has hx of drug use during the pregnancy but baby UDS is negative. [facility] that she started pumping during the pregnancy at 38 weeks and had some colostrum saved but it's almost gone and now she's getting frustrated because she's not getting volume, she got 30 ml on her first pumping session yesterday with our [employer] hospital pump and now she's only getting less than 5 ml with her Babybella breast pump that she got off [employer].  Per mom she's no using our hospital pump because her personal pump has better suction. Explained to mom the different settings in her hospital pump and how they work. She said she'll switch back to our pump for her future pumping sessions. Discussed galactagogues, handout for fenugreek & lactation cookies, hydration and lactogenesis II, mom is drinking less than 64 oz water/day. Encouraged mom to keep pumping every 2-3 hours and praised her for her efforts. Parents reported all questions and concerns were answered, they're both aware of LC services and will call PRN.  Maternal Data    Feeding Feeding Type: Breast Milk with Formula added Nipple Type: Slow - flow  LATCH Score                   Interventions Interventions: Breast feeding basics reviewed  Lactation Tools Discussed/Used     Consult Status Consult Status: Follow-up Date: 08/05/18 Follow-up type: In-patient    Siomara Burkel Venetia ConstableS Cohl Behrens 08/04/2018, 6:47 PM

## 2018-08-04 NOTE — Anesthesia Procedure Notes (Signed)
Spinal  Start time: 08/04/2018 9:10 AM End time: 08/04/2018 9:12 AM Staffing Anesthesiologist: Shelton SilvasHollis, Kele Withem D, MD Performed: anesthesiologist  Preanesthetic Checklist Completed: patient identified, site marked, surgical consent, pre-op evaluation, timeout performed, IV checked, risks and benefits discussed and monitors and equipment checked Spinal Block Patient position: sitting Prep: site prepped and draped and DuraPrep Location: L3-4 Injection technique: single-shot Needle Needle type: Pencan  Needle gauge: 24 G Needle length: 10 cm Needle insertion depth: 10 cm Additional Notes Patient tolerated well. No immediate complications.

## 2018-08-04 NOTE — Anesthesia Preprocedure Evaluation (Addendum)
Anesthesia Evaluation  Patient identified by MRN, date of birth, ID band Patient awake    Reviewed: Allergy & Precautions, NPO status , Patient's Chart, lab work & pertinent test results  Airway Mallampati: I       Dental no notable dental hx.    Pulmonary Current Smoker,    Pulmonary exam normal        Cardiovascular negative cardio ROS Normal cardiovascular exam     Neuro/Psych Anxiety Depression Bipolar Disorder    GI/Hepatic Neg liver ROS,   Endo/Other    Renal/GU negative Renal ROS     Musculoskeletal   Abdominal   Peds  Hematology   Anesthesia Other Findings   Reproductive/Obstetrics                            Anesthesia Physical  Anesthesia Plan  ASA: II  Anesthesia Plan: Spinal   Post-op Pain Management:    Induction: Intravenous  PONV Risk Score and Plan: 2 and Ondansetron and Treatment may vary due to age or medical condition  Airway Management Planned: Natural Airway and Simple Face Mask  Additional Equipment: None  Intra-op Plan:   Post-operative Plan:   Informed Consent: I have reviewed the patients History and Physical, chart, labs and discussed the procedure including the risks, benefits and alternatives for the proposed anesthesia with the patient or authorized representative who has indicated his/her understanding and acceptance.     Plan Discussed with: CRNA  Anesthesia Plan Comments: (Lab Results      Component                Value               Date                      WBC                      14.7 (H)            08/04/2018                HGB                      10.4 (L)            08/04/2018                HCT                      31.5 (L)            08/04/2018                MCV                      93.8                08/04/2018                PLT                      278                 08/04/2018           )      Anesthesia Quick  Evaluation

## 2018-08-04 NOTE — Progress Notes (Signed)
Called OR to inform them pt. Requesting CRNA restart IV .  Did not want RN to start during night.  Pt NPO since midnight, with small sip of water with motrin at 0600.

## 2018-08-04 NOTE — Progress Notes (Signed)
PPD #1 No problems, baby in NICU-had fever Afeb, VSS Fundus firm, NT at U-1 Continue routine postpartum care, for BTL this morning

## 2018-08-05 ENCOUNTER — Ambulatory Visit: Payer: Self-pay

## 2018-08-05 MED ORDER — IBUPROFEN 600 MG PO TABS: 600.0000 mg | ORAL_TABLET | Freq: Four times a day (QID) | ORAL | 0 refills | Status: AC

## 2018-08-05 NOTE — Lactation Note (Signed)
This note was copied from a baby's chart. Lactation Consultation Note  Patient Name: Boy Collene GobbleBrittany Plancarte Today's Date: 08/05/2018  Attempted to follow up with mom at 1700.  Not in room.   Maternal Data    Feeding    LATCH Score                   Interventions    Lactation Tools Discussed/Used     Consult Status      Jaklyn Alen Michaelle CopasS Akif Weldy 08/05/2018, 5:06 PM

## 2018-08-05 NOTE — Progress Notes (Signed)
Post Partum Day 2 Subjective: no complaints and tolerating PO  States milk came in and feeling encouraged by that  Objective: Blood pressure 134/81, pulse 60, temperature 98.4 F (36.9 C), temperature source Oral, resp. rate 18, height 5\' 5"  (1.651 m), weight 63.9 kg, last menstrual period 10/22/2017, SpO2 98 %, unknown if currently breastfeeding.  Physical Exam:  General: alert and cooperative Lochia: appropriate Uterine Fundus: firm Incision C/D/I  Recent Labs    08/02/18 1623 08/04/18 0755  HGB 11.7* 10.4*  HCT 35.6* 31.5*    Assessment/Plan: Discharge home but will likely be rooming in with baby    LOS: 3 days   Kimberly Barrera 08/05/2018, 9:38 AM

## 2018-08-05 NOTE — Discharge Summary (Signed)
OB Discharge Summary     Patient Name: Kimberly Barrera DOB: 12/10/1990 MRN: [id number]  Date of admission: 08/02/2018 Delivering MD: Jackelyn KnifeMEISINGER, TODD   Date of discharge: 08/05/2018  Admitting diagnosis: INDUCTION Intrauterine pregnancy: 2860w1d     Secondary diagnosis:  Active Problems:   Indication for care in labor or delivery   SVD (spontaneous vaginal delivery)  Additional problems: h/o polysubstance abuse, stable on suboxone H/o bipolar--stable on zoloft     Discharge diagnosis: Term Pregnancy Delivered                                                                                                Post partum procedures:postpartum tubal ligation  Augmentation: AROM and Pitocin  Complications: None  Hospital course:  Induction of Labor With Vaginal Delivery   27 y.o. yo [id number] at 2060w1d was admitted to the hospital 08/02/2018 for induction of labor.  Indication for induction: Favorable cervix at term.  Patient had an uncomplicated labor course as follows: Membrane Rupture Time/Date: 8:44 PM ,08/02/2018   Intrapartum Procedures: Episiotomy: None [1]                                         Lacerations:  None [1]  Patient had delivery of a Viable infant.  Information for the patient's newborn:  Pauline GoodSanders, Boy Kemoni [[id number]][[id number]]  Delivery Method: Vaginal, Spontaneous(Filed from Delivery Summary)   08/03/2018  Details of delivery can be found in separate delivery note.  Patient had a routine postpartum course and was seen by SW prior to d/c.  Baby's UDS was negative. Patient is discharged home 08/05/18.  Physical exam  Vitals:   08/04/18 1600 08/04/18 2123 08/05/18 0518 08/05/18 0755  BP: 119/79 123/84 109/83 134/81  Pulse: (!) 58 61 (!) 50 60  Resp: 18 18 18 18   Temp:  98.6 F (37 C) 98.3 F (36.8 C) 98.4 F (36.9 C)  TempSrc:  Oral Oral Oral  SpO2: 98% 97% 99% 98%  Weight:      Height:       General: alert and cooperative Uterine Fundus: firm Incision:  Dressing is clean, dry, and intact  Labs: Lab Results  Component Value Date   WBC 14.7 (H) 08/04/2018   HGB 10.4 (L) 08/04/2018   HCT 31.5 (L) 08/04/2018   MCV 93.8 08/04/2018   PLT 278 08/04/2018   CMP Latest Ref Rng & Units 12/19/2017  Glucose 65 - 99 mg/dL 99  BUN 6 - 20 mg/dL <1(H<5(L)  Creatinine 0.860.44 - 1.00 mg/dL 5.780.59  Sodium 469135 - 629145 mmol/L 134(L)  Potassium 3.5 - 5.1 mmol/L 3.8  Chloride 101 - 111 mmol/L 103  CO2 22 - 32 mmol/L 23  Calcium 8.9 - 10.3 mg/dL 9.1  Total Protein 6.5 - 8.1 g/dL 6.7  Total Bilirubin 0.3 - 1.2 mg/dL 0.6  Alkaline Phos 38 - 126 U/L 71  AST 15 - 41 U/L 35  ALT 14 - 54 U/L 48    Discharge instruction: per After Visit  Summary and "Baby and Me Booklet".  After visit meds:  Allergies as of 08/05/2018      Reactions   Bee Venom Swelling      Medication List    STOP taking these medications   diphenhydramine-acetaminophen 25-500 MG Tabs tablet Commonly known as:  TYLENOL PM   promethazine 25 MG tablet Commonly known as:  PHENERGAN     TAKE these medications   acetaminophen 325 MG tablet Commonly known as:  TYLENOL Take 325 mg by mouth every 6 (six) hours as needed for moderate pain.   buprenorphine-naloxone 8-2 mg Subl SL tablet Commonly known as:  SUBOXONE Place 0.25 tablets under the tongue daily.   ibuprofen 600 MG tablet Commonly known as:  ADVIL,MOTRIN Take 1 tablet (600 mg total) by mouth every 6 (six) hours.   multivitamin-prenatal 27-0.8 MG Tabs tablet Take 1 tablet by mouth daily at 12 noon.   sertraline 100 MG tablet Commonly known as:  ZOLOFT Take 100 mg by mouth daily.       Diet: routine diet  Activity: Advance as tolerated. Pelvic rest for 6 weeks.   Outpatient follow up:6 weeks Follow up Appt:No future appointments. Follow up Visit:No follow-ups on file.  Postpartum contraception: Tubal Ligation  Newborn Data: Live born female  Birth Weight: 6 lb 9.6 oz (2994 g) APGAR: 6, 9  Newborn Delivery   Birth  date/time:  08/03/2018 03:46:00 Delivery type:  Vaginal, Spontaneous     Baby Feeding: Breast Disposition:NICU for suboxone observation   08/05/2018 Oliver PilaKathy W Phoua Hoadley, MD

## 2018-08-05 NOTE — Lactation Note (Signed)
This note was copied from a baby's chart. Lactation Consultation Note  Patient Name: Boy Collene GobbleBrittany Walla Today's Date: 08/05/2018   Attempted to follow up with mom, mom not in the room currently.      Maternal Data    Feeding Feeding Type: Breast Milk with Formula added Nipple Type: Slow - flow  LATCH Score                   Interventions    Lactation Tools Discussed/Used     Consult Status      Silas FloodSharon S Voncille Simm 08/05/2018, 8:52 AM

## 2018-08-05 NOTE — Lactation Note (Signed)
This note was copied from a baby's chart. Lactation Consultation Note  Patient Name: Boy Collene GobbleBrittany Alcantar Today's Date: 08/05/2018 Reason for consult: Term;NICU baby(per dad mom in NICU feeding the baby / LC asked dad to have mom call on the nurses light when she is back  in the room )   Maternal Data    Feeding Feeding Type: Breast Milk with Formula added Nipple Type: Slow - flow  LATCH Score                   Interventions    Lactation Tools Discussed/Used     Consult Status Consult Status: Follow-up Date: 08/05/18 Follow-up type: In-patient    Matilde SprangMargaret Ann Lashay Osborne 08/05/2018, 9:57 AM

## 2018-08-05 NOTE — Lactation Note (Signed)
This note was copied from a baby's chart. Lactation Consultation Note  Patient Name: Kimberly Collene GobbleBrittany Barrera Today's Date: 08/05/2018  Attempt to follow up with mom this pm.  Note on door asking not to be disturbed.  Will follow up with mom in am.   Maternal Data    Feeding Feeding Type: Breast Milk with Formula added  LATCH Score                   Interventions    Lactation Tools Discussed/Used     Consult Status      Kimberly Barrera Kimberly Barrera 08/05/2018, 10:18 PM

## 2018-08-06 ENCOUNTER — Ambulatory Visit: Payer: Self-pay

## 2018-08-06 NOTE — Lactation Note (Signed)
This note was copied from a baby's chart. Lactation Consultation Note:  Mother reports that she is pumping exclusively.  She last pumped 60 ml .  Mother has a Armed forces logistics/support/administrative officerBaby Bella electric pump at home.  I advised that she may want to invest in a Pump that is higher grade when exclusively pumping.  Encouraged mother to do good breast massage and to pump every 2-3 hours for 15-20 mins .  Mother is not active with WIC.  Discussed treatment and prevention of engorgement.  [facility] with concerns or questions about her milk supply.  Mother also informed of available BFSG and outpatient services.  Mother denies having any concerns about supply or pumping.   Patient Name: Kimberly Collene GobbleBrittany Admire AYTKZ'SToday's Date: 08/06/2018 Reason for consult: Follow-up assessment   Maternal Data    Feeding Feeding Type: Breast Milk  LATCH Score                   Interventions Interventions: Hand express;DEBP;Ice  Lactation Tools Discussed/Used     Consult Status      Michel BickersKendrick, Amaziah Raisanen McCoy 08/06/2018, 9:29 AM

## 2018-08-07 ENCOUNTER — Ambulatory Visit: Payer: Self-pay

## 2018-08-07 NOTE — Lactation Note (Signed)
This note was copied from a baby's chart. Lactation Consultation Note  Patient Name: Kimberly Barrera Today's Date: 08/07/2018   Parents sleeping on arrival.  Mom reports she is happy her milk as come in well with this baby.  Pumped 2 oz from each breast at last pumping. Mom reports she plans to exclusively pump.  Mom requests manual breast pump.  Gave mom Harmony manual breastpump.  Urged parents to take all pump parts home with them.  Mom has already taken piece with conversion kit home.  Attempt to explain how to use that part of pump kit as manual pump single and double.Urged mom to pump 8 or more in 24 hours and to try and pump every time infant is given a bottle.   Mom reprots she knows storage guidelines this is not her first baby.  Urged mom to add hands to pumping to get more milk.  Urged mom to follow up with lactation as needed.   Maternal Data    Feeding    LATCH Score                   Interventions    Lactation Tools Discussed/Used     Consult Status      Jaan Fischel Thompson Caul 08/07/2018, 9:30 AM

## 2018-08-28 ENCOUNTER — Ambulatory Visit (INDEPENDENT_AMBULATORY_CARE_PROVIDER_SITE_OTHER): Payer: Medicaid Other | Admitting: Family Medicine

## 2018-08-28 ENCOUNTER — Other Ambulatory Visit: Payer: Self-pay

## 2018-08-28 ENCOUNTER — Encounter: Payer: Self-pay | Admitting: Family Medicine

## 2018-08-28 VITALS — BP 122/64 | HR 88 | Temp 100.3°F | Resp 16 | Ht 65.0 in | Wt 130.0 lb

## 2018-08-28 DIAGNOSIS — N61 Mastitis without abscess: Secondary | ICD-10-CM | POA: Diagnosis not present

## 2018-08-28 LAB — INFLUENZA A AND B AG, IMMUNOASSAY
INFLUENZA A ANTIGEN: NOT DETECTED
INFLUENZA B ANTIGEN: NOT DETECTED

## 2018-08-28 MED ORDER — FLUCONAZOLE 150 MG PO TABS: 150.0000 mg | ORAL_TABLET | Freq: Once | ORAL | 1 refills | Status: AC

## 2018-08-28 MED ORDER — SULFAMETHOXAZOLE-TRIMETHOPRIM 800-160 MG PO TABS: 1.0000 | ORAL_TABLET | Freq: Two times a day (BID) | ORAL | 0 refills | Status: DC

## 2018-08-28 NOTE — Patient Instructions (Addendum)
F/U as needed    Mastitis  Mastitis is irritation and swelling (inflammation) in an area of the breast. It is often caused by an infection that occurs when germs (bacteria) enter the skin. This most often happens to breastfeeding mothers, but it can happen to other women too as well as some men. Follow these instructions at home: Medicines  Take over-the-counter and prescription medicines only as told by your doctor.  If you were prescribed an antibiotic medicine, take it as told by your doctor. Do not stop taking it even if you start to feel better. General instructions  Do not wear a tight or underwire bra. Wear a soft support bra.  Drink more fluids, especially if you have a fever.  Get plenty of rest. If you are breastfeeding:   Keep emptying your breasts by breastfeeding or by using a breast pump.  Keep your nipples clean and dry.  During breastfeeding, empty the first breast before going to the other breast. Use a breast pump if your baby is not emptying your breasts.  Massage your breasts during feeding or pumping as told by your doctor.  If told, put moist heat on the affected area of your breast right before breastfeeding or pumping. Use the heat source that your doctor tells you to use.  If told, put ice on the affected area of your breast right after breastfeeding or pumping: ? Put ice in a plastic bag. ? Place a towel between your skin and the bag. ? Leave the ice on for 20 minutes.  If you go back to work, pump your breasts while at work.  Avoid letting your breasts get overly filled with milk (engorged). Contact a doctor if:  You have pus-like fluid leaking from your breast.  You have a fever.  Your symptoms do not get better within 2 days. Get help right away if:  Your pain and swelling are getting worse.  Your pain is not helped by medicine.  You have a red line going from your breast toward your armpit. Summary  Mastitis is irritation and  swelling in an area of the breast.  If you were prescribed an antibiotic medicine, do not stop taking it even if you start to feel better.  Drink more fluids and get plenty of rest.  Contact a doctor if your symptoms do not get better within 2 days. This information is not intended to replace advice given to you by your health care provider. Make sure you discuss any questions you have with your health care provider. Document Released: 07/19/2009 Document Revised: 08/22/2016 Document Reviewed: 08/22/2016 Elsevier Interactive Patient Education  2019 [employer].

## 2018-08-28 NOTE — Progress Notes (Signed)
   Subjective:    Patient ID: Kimberly Barrera, female    DOB: [birth year], 28 y.o.   MRN: [id number]  Patient presents for Illness (Xx1 day- fever (100.8), body aches, HA)  Patient here with fever that started early this morning.  She has had breast pain for the past 2 days.  She had stopped pumping as much.  She has tenderness around her right breast.  She also has body aches and a headache.  She does not had a cough or congestion she has not been around anyone with influenza or recent illness.  She has not had any nausea vomiting no diarrhea.   Review Of Systems:  GEN- denies fatigue,+ fever, weight loss,weakness, recent illness HEENT- denies eye drainage, change in vision, nasal discharge, CVS- denies chest pain, palpitations RESP- denies SOB, cough, wheeze ABD- denies N/V, change in stools, abd pain GU- denies dysuria, hematuria, dribbling, incontinence MSK- denies joint pain,+ muscle aches, injury Neuro- denies headache, dizziness, syncope, seizure activity       Objective:    BP 122/64   Pulse 88   Temp 100.3 F (37.9 C) (Temporal)   Resp 16   Ht 5\' 5"  (1.651 m)   Wt 130 lb (59 kg)   LMP 10/22/2017 (Approximate)   SpO2 96%   BMI 21.63 kg/m  GEN- NAD, alert and oriented x 3  - fiancee in room Breast- normal symmetry, no nipple inversion,no nipple drainage, TTP over right breast lower quadrants, hardened areas palpated along duct lines, no induration in skin, left breast soft Nodes- no axillary nodes HEENT- PERRL, EOMI, non injected sclera, pink conjunctiva, MMM, oropharynx clear, TM clear bilat no effusion, no  maxillary sinus tenderness, nares clear  Neck- Supple, no LAD CVS- RRR, no murmur RESP-CTAB EXT- No edema Pulses- Radial 2+           Assessment & Plan:      Problem List Items Addressed This Visit    None    Visit Diagnoses    Mastitis    -  Primary   concern for probable mastitis, Did check flu it was neg, will cover with bactrim has  signifant history substance abuse, higher risk for MRSA. Pump more. Other posiblity is early viral illness but based on exam will cover per above. Compresses to breast   Relevant Orders   Influenza A and B Ag, Immunoassay (Completed)      Note: This dictation was prepared with Dragon dictation along with smaller phrase technology. Any transcriptional errors that result from this process are unintentional.

## 2018-08-29 ENCOUNTER — Encounter: Payer: Self-pay | Admitting: Family Medicine

## 2018-09-06 ENCOUNTER — Telehealth: Payer: Self-pay | Admitting: Family Medicine

## 2018-09-06 MED ORDER — OSELTAMIVIR PHOSPHATE 75 MG PO CAPS: 75.0000 mg | ORAL_CAPSULE | Freq: Every day | ORAL | 0 refills | Status: DC

## 2018-09-06 NOTE — Telephone Encounter (Signed)
ordered

## 2018-09-06 NOTE — Telephone Encounter (Signed)
Kimberly Barrera patient's boyfriend contacted office for Tamiflu.   Ok to order?

## 2018-09-06 NOTE — Telephone Encounter (Signed)
Chrissie Noa called pts nephew was seen in our clinic earlier and dx with influenza type b has been exposed to him. Also her stepdaughter is now have sx. Would like tamiflu called in to [facility].

## 2018-10-22 ENCOUNTER — Ambulatory Visit: Payer: Medicaid Other | Admitting: Family Medicine

## 2018-10-23 ENCOUNTER — Ambulatory Visit (INDEPENDENT_AMBULATORY_CARE_PROVIDER_SITE_OTHER): Payer: Medicaid Other | Admitting: Family Medicine

## 2018-10-23 ENCOUNTER — Encounter: Payer: Self-pay | Admitting: Family Medicine

## 2018-10-23 ENCOUNTER — Other Ambulatory Visit: Payer: Self-pay

## 2018-10-23 VITALS — BP 122/64 | HR 70 | Temp 98.7°F | Resp 14 | Ht 65.0 in | Wt 138.0 lb

## 2018-10-23 DIAGNOSIS — F411 Generalized anxiety disorder: Secondary | ICD-10-CM

## 2018-10-23 DIAGNOSIS — F319 Bipolar disorder, unspecified: Secondary | ICD-10-CM | POA: Diagnosis not present

## 2018-10-23 DIAGNOSIS — G5691 Unspecified mononeuropathy of right upper limb: Secondary | ICD-10-CM

## 2018-10-23 MED ORDER — SERTRALINE HCL 100 MG PO TABS: 100.0000 mg | ORAL_TABLET | Freq: Every day | ORAL | 6 refills | Status: AC

## 2018-10-23 MED FILL — SUBOXONE 8 MG-2 MG SL FILM: 8-2 | 14 days supply | Qty: 42 | Fill #0

## 2018-10-23 NOTE — Progress Notes (Signed)
   Subjective:    Patient ID: Kimberly Barrera, female    DOB: Oct 20, 1990, 28 y.o.   MRN: [id number]  Patient presents for Hand Pain (R>L- pain in hands and wrist- numbness in fingers)   RIght hand pain, up to elbow, worse at night time, has to get up a few times out of bed to make pain stop, has tried sleeping with hand brace I have 3 years ago when she initially started with symptoms.  Will get pale hands All fingers go numb, so painful feels like they are fire at times. Has had swelling on and off.  Symptoms started when she was injecting a lot of drugs especially in her right arm a few years ago. Symptoms did improve but since she has had her son much worse.  Carrying son and groceries will also cause symptoms  No neck pain Taking suboxone from pain clinic  and ibuprofen for tooth infection and antibiotics currently    [facility] provides suboxone they are setting her up with , therapy in 3 weeks  She needs zoloft refilled, also feels that it may be wearing off, but every time has zoloft goes up feels good but a few weeks later back down like before. Has bipolar history not on mood stabilizer but willing to see psychiatrist    Review Of Systems:  GEN- denies fatigue, fever, weight loss,weakness, recent illness HEENT- denies eye drainage, change in vision, nasal discharge, CVS- denies chest pain, palpitations RESP- denies SOB, cough, wheeze ABD- denies N/V, change in stools, abd pain GU- denies dysuria, hematuria, dribbling, incontinence MSK- +joint pain, muscle aches, injury Neuro- denies headache, dizziness, syncope, seizure activity       Objective:    BP 122/64   Pulse 70   Temp 98.7 F (37.1 C) (Oral)   Resp 14   Ht 5\' 5"  (1.651 m)   Wt 138 lb (62.6 kg)   LMP 09/24/2018   SpO2 97%   BMI 22.96 kg/m  GEN- NAD, alert and oriented x3 HEENT- PERRL, EOMI, non injected sclera, pink conjunctiva, MMM, oropharynx clear Neck- Supple, no thyromegaly, neg  spurlings, C spine NT CVS- RRR, no murmur RESP-CTAB MSK- FROM upper ext, elbow, wrist, hand, able to grasp  Neuro CNII-XII in tact, normal monofilament hands bilat, +tinels, normal tone in hands Pulses- Radial  2+        Assessment & Plan:      Problem List Items Addressed This Visit      Unprioritized   Bipolar 1 disorder, depressed (HCC)    With bipolar history, anxiety, substance abuse history now on subxone recommend not increasing zoloft and seeing psychiatry to get on appropriate mood stabilizer      Relevant Orders   Ambulatory referral to Psychiatry   GAD (generalized anxiety disorder)   Relevant Medications   sertraline (ZOLOFT) 100 MG tablet   Other Relevant Orders   Ambulatory referral to Psychiatry    Other Visit Diagnoses    Neuropathy of right hand    -  Primary   referral for nerve conduction, will not add any other meds at this time, concern nerve damage from drug use   Relevant Medications   sertraline (ZOLOFT) 100 MG tablet   Other Relevant Orders   Nerve conduction test      Note: This dictation was prepared with Dragon dictation along with smaller phrase technology. Any transcriptional errors that result from this process are unintentional.

## 2018-10-23 NOTE — Patient Instructions (Addendum)
Nerve conduction  Continue zoloft Call for psychiatry  F/U pending results

## 2018-10-24 ENCOUNTER — Encounter: Payer: Self-pay | Admitting: Family Medicine

## 2018-10-24 NOTE — Assessment & Plan Note (Signed)
With bipolar history, anxiety, substance abuse history now on subxone recommend not increasing zoloft and seeing psychiatry to get on appropriate mood stabilizer

## 2018-11-08 MED FILL — SUBOXONE 8 MG-2 MG SL FILM: 8-2 | 28 days supply | Qty: 84 | Fill #0

## 2018-12-05 MED FILL — SUBOXONE 8 MG-2 MG SL FILM: 8-2 | 28 days supply | Qty: 84 | Fill #0

## 2019-01-02 MED FILL — SUBOXONE 8 MG-2 MG SL FILM: 8-2 | 28 days supply | Qty: 84 | Fill #0

## 2019-01-07 ENCOUNTER — Other Ambulatory Visit: Payer: Self-pay | Admitting: Family Medicine

## 2019-01-07 DIAGNOSIS — G5691 Unspecified mononeuropathy of right upper limb: Secondary | ICD-10-CM

## 2019-01-31 MED FILL — SUBOXONE 8 MG-2 MG SL FILM: 8-2 | 18 days supply | Qty: 54 | Fill #0

## 2019-02-05 ENCOUNTER — Telehealth: Payer: Self-pay | Admitting: Family Medicine

## 2019-02-05 MED ORDER — FLUCONAZOLE 150 MG PO TABS: 150.0000 mg | ORAL_TABLET | Freq: Once | ORAL | 1 refills | Status: AC

## 2019-02-05 NOTE — Telephone Encounter (Signed)
Ok to send Diflucan?

## 2019-02-05 NOTE — Telephone Encounter (Signed)
Prescription sent to pharmacy.

## 2019-02-05 NOTE — Telephone Encounter (Signed)
Patient says that dr Buelah Manis know she gets yeast infections all the time and wants to know if two doses of diflucan can be called in for her if possible  [facility]

## 2019-02-05 NOTE — Telephone Encounter (Signed)
Okay to send in diflucan

## 2019-02-18 MED FILL — SUBOXONE 8 MG-2 MG SL FILM: 8-2 | 9 days supply | Qty: 27 | Fill #0

## 2019-02-27 MED FILL — SUBOXONE 8 MG-2 MG SL FILM: 8-2 | 28 days supply | Qty: 84 | Fill #0

## 2019-03-10 ENCOUNTER — Encounter: Payer: Medicaid Other | Admitting: Neurology

## 2019-03-10 ENCOUNTER — Telehealth: Payer: Self-pay | Admitting: Neurology

## 2019-03-10 NOTE — Telephone Encounter (Signed)
This patient did not show for an [facility] appointment today. 

## 2019-04-15 MED FILL — SUBOXONE 8 MG-2 MG SL FILM: 8-2 | 14 days supply | Qty: 42 | Fill #0

## 2019-04-29 MED FILL — SUBOXONE 8 MG-2 MG SL FILM: 8-2 | 30 days supply | Qty: 90 | Fill #0

## 2019-06-10 MED FILL — SUBOXONE 8 MG-2 MG SL FILM: 8-2 | 14 days supply | Qty: 42 | Fill #0

## 2019-06-23 ENCOUNTER — Emergency Department (HOSPITAL_COMMUNITY)
Admission: EM | Admit: 2019-06-23 | Discharge: 2019-06-23 | Discharge status: Against Medical Advice | Payer: Medicaid Other

## 2019-06-23 ENCOUNTER — Other Ambulatory Visit: Payer: Self-pay

## 2019-06-23 NOTE — ED Notes (Signed)
Pt did not respond when called for triage 

## 2019-06-30 MED FILL — SUBOXONE 8 MG-2 MG SL FILM: 8-2 | 7 days supply | Qty: 21 | Fill #0

## 2019-08-05 MED FILL — SUBOXONE 8 MG-2 MG SL FILM: 8-2 | 28 days supply | Qty: 84 | Fill #0

## 2019-09-02 MED FILL — SUBOXONE 8 MG-2 MG SL FILM: 8-2 | 28 days supply | Qty: 84 | Fill #0

## 2019-09-30 MED FILL — SUBOXONE 8 MG-2 MG SL FILM: 8-2 | 28 days supply | Qty: 84 | Fill #0

## 2019-10-10 MED FILL — SUBOXONE 8 MG-2 MG SL FILM: 8-2 | 28 days supply | Qty: 84 | Fill #0
